# Patient Record
Sex: Female | Born: 1960 | Race: White | Hispanic: No | Marital: Married | Smoking: Former smoker
Health system: Southern US, Community
[De-identification: ages and names within clinical notes are randomized; demographics above are authoritative.]

## PROBLEM LIST (undated history)

## (undated) DIAGNOSIS — I70219 Atherosclerosis of native arteries of extremities with intermittent claudication, unspecified extremity: Secondary | ICD-10-CM

## (undated) DIAGNOSIS — Z9189 Other specified personal risk factors, not elsewhere classified: Secondary | ICD-10-CM

## (undated) DIAGNOSIS — J449 Chronic obstructive pulmonary disease, unspecified: Secondary | ICD-10-CM

## (undated) DIAGNOSIS — M25571 Pain in right ankle and joints of right foot: Secondary | ICD-10-CM

## (undated) DIAGNOSIS — M25562 Pain in left knee: Secondary | ICD-10-CM

## (undated) DIAGNOSIS — G8929 Other chronic pain: Secondary | ICD-10-CM

## (undated) DIAGNOSIS — J984 Other disorders of lung: Secondary | ICD-10-CM

## (undated) DIAGNOSIS — I10 Essential (primary) hypertension: Secondary | ICD-10-CM

## (undated) DIAGNOSIS — M5416 Radiculopathy, lumbar region: Secondary | ICD-10-CM

## (undated) DIAGNOSIS — M25572 Pain in left ankle and joints of left foot: Secondary | ICD-10-CM

## (undated) DIAGNOSIS — F17210 Nicotine dependence, cigarettes, uncomplicated: Secondary | ICD-10-CM

## (undated) DIAGNOSIS — M25561 Pain in right knee: Secondary | ICD-10-CM

## (undated) DIAGNOSIS — R0609 Other forms of dyspnea: Secondary | ICD-10-CM

## (undated) DIAGNOSIS — M791 Myalgia, unspecified site: Secondary | ICD-10-CM

## (undated) DIAGNOSIS — R002 Palpitations: Secondary | ICD-10-CM

## (undated) HISTORY — DX: Other specified personal risk factors, not elsewhere classified: Z91.89

## (undated) HISTORY — DX: Other chronic pain: G89.29

## (undated) HISTORY — DX: Pain in right ankle and joints of right foot: M25.571

## (undated) HISTORY — DX: Other disorders of lung: J98.4

## (undated) HISTORY — DX: Chronic obstructive pulmonary disease, unspecified: J44.9

## (undated) HISTORY — DX: Radiculopathy, lumbar region: M54.16

## (undated) HISTORY — PX: CHOLECYSTECTOMY: SHX55

## (undated) HISTORY — DX: Pain in left knee: M25.562

## (undated) HISTORY — DX: Palpitations: R00.2

## (undated) HISTORY — DX: Pain in left ankle and joints of left foot: M25.572

## (undated) HISTORY — DX: Other forms of dyspnea: R06.09

## (undated) HISTORY — DX: Morbid (severe) obesity due to excess calories: E66.01

## (undated) HISTORY — DX: Pain in right knee: M25.561

## (undated) HISTORY — DX: Myalgia, unspecified site: M79.10

## (undated) HISTORY — DX: Nicotine dependence, cigarettes, uncomplicated: F17.210

## (undated) HISTORY — DX: Essential (primary) hypertension: I10

## (undated) HISTORY — PX: ABDOMINAL HYSTERECTOMY: SHX81

## (undated) HISTORY — PX: BACK SURGERY: SHX140

## (undated) HISTORY — DX: Atherosclerosis of native arteries of extremities with intermittent claudication, unspecified extremity: I70.219

## (undated) HISTORY — PX: SKIN CANCER EXCISION: SHX779

---

## 2008-07-04 ENCOUNTER — Ambulatory Visit: Payer: Self-pay | Admitting: Cardiology

## 2008-07-10 ENCOUNTER — Ambulatory Visit: Payer: Self-pay | Admitting: Cardiovascular Disease

## 2008-07-10 ENCOUNTER — Ambulatory Visit: Payer: Self-pay | Admitting: Cardiology

## 2008-07-19 ENCOUNTER — Encounter: Payer: Self-pay | Admitting: Internal Medicine

## 2008-07-19 ENCOUNTER — Ambulatory Visit: Payer: Self-pay | Admitting: Internal Medicine

## 2008-08-07 ENCOUNTER — Ambulatory Visit: Payer: Self-pay | Admitting: Pulmonary Disease

## 2008-08-07 ENCOUNTER — Encounter: Payer: Self-pay | Admitting: Cardiovascular Disease

## 2008-08-07 ENCOUNTER — Ambulatory Visit: Payer: Self-pay | Admitting: Cardiovascular Disease

## 2008-08-07 DIAGNOSIS — I10 Essential (primary) hypertension: Secondary | ICD-10-CM

## 2008-08-07 DIAGNOSIS — Z87891 Personal history of nicotine dependence: Secondary | ICD-10-CM | POA: Insufficient documentation

## 2008-08-07 DIAGNOSIS — F17210 Nicotine dependence, cigarettes, uncomplicated: Secondary | ICD-10-CM

## 2008-08-07 DIAGNOSIS — J984 Other disorders of lung: Secondary | ICD-10-CM

## 2008-08-07 DIAGNOSIS — Z9189 Other specified personal risk factors, not elsewhere classified: Secondary | ICD-10-CM | POA: Insufficient documentation

## 2008-08-07 DIAGNOSIS — R0609 Other forms of dyspnea: Secondary | ICD-10-CM | POA: Insufficient documentation

## 2008-08-07 DIAGNOSIS — R0600 Dyspnea, unspecified: Secondary | ICD-10-CM | POA: Insufficient documentation

## 2008-08-07 DIAGNOSIS — I152 Hypertension secondary to endocrine disorders: Secondary | ICD-10-CM | POA: Insufficient documentation

## 2008-08-07 HISTORY — DX: Dyspnea, unspecified: R06.00

## 2008-08-07 HISTORY — DX: Other disorders of lung: J98.4

## 2008-08-07 HISTORY — DX: Other specified personal risk factors, not elsewhere classified: Z91.89

## 2008-08-07 HISTORY — DX: Nicotine dependence, cigarettes, uncomplicated: F17.210

## 2008-08-07 HISTORY — DX: Other forms of dyspnea: R06.09

## 2008-08-07 HISTORY — DX: Essential (primary) hypertension: I10

## 2008-12-28 ENCOUNTER — Encounter (INDEPENDENT_AMBULATORY_CARE_PROVIDER_SITE_OTHER): Payer: Self-pay | Admitting: *Deleted

## 2009-02-02 ENCOUNTER — Encounter: Payer: Self-pay | Admitting: Pulmonary Disease

## 2010-05-12 ENCOUNTER — Encounter: Payer: Self-pay | Admitting: Pulmonary Disease

## 2010-09-03 NOTE — Assessment & Plan Note (Signed)
[facility]                            CARDIOLOGY OFFICE NOTE   NAME:Robin Keller, Robin Keller                       MRN:          [id number]  DATE:07/10/2008                            DOB:          15-May-1960    A 50 year old patient referred for Robin Keller for ongoing chest pain  and shortness of breath.  The patient also has hypertension and is a  smoker.   The patient was actually admitted to [facility] last Friday or  Saturday.  I reviewed records from there.  Her EKGs were normal.  She  ruled out for myocardial infarction.  She had an exercise stress Myoview  study.  She was able to exercise for about 6 minutes.  Nuclear images  were normal with a good EF.  There was no evidence of ischemia or  infarct.  She followed up with Dr. Prudy Feeler.  She continues to have  sensation of fullness in her chest, it is localized to the left breast  area.  It is not actually a pain or pressure, it is a fullness.  She has  continued shortness of breath.  She has over a 50-pack-year history of  smoking and I suspect as significant emphysema.   Her chest x-ray at [facility] was normal with no evidence of cancer or  lung nodules.  There is no effusion and no cardiomegaly.   I do not see that she had a D-dimer.   The patient continues to have her dyspnea.  There has been no cough or  sputum production.  No hemoptysis.  No pleuritic component.  No lower  extremity edema.   She was recently started on lisinopril and Imdur 30 mg a day for blood  pressure control and symptomatic relief.   Her symptoms have not worsened, but they are persistent.   Nothing in particular she does makes feel better.  They are not  associated with GI symptoms, nausea, GERD, reflux, increase gas, or  diarrhea.   PAST MEDICAL HISTORY:  Remarkable for smoking.  She has had a previous  hysterectomy, previous cholecystectomy, two previous lumbar disk  surgeries, appendectomy, right  wrist surgery for ganglion cyst.   SOCIAL HISTORY:  The patient is married.  She lives with her husband.  He is retired, but she is active.  They have two children.  She is a  smoker.  She does not drink.  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  She works 40-60 hours per  week.  She is fairly sedentary.   FAMILY HISTORY:  Positive for premature coronary disease in mother's  side.   CURRENT MEDICATIONS:  1. Lisinopril 20 a day.  2. Imdur 30 a day.  3. Aspirin a day.   ALLERGIES:  She has no known allergies.   REVIEW OF SYSTEMS:  Otherwise negative.   PHYSICAL EXAMINATION:  GENERAL:  Remarkable for an overweight bronchitic  female in no distress.  VITAL SIGNS:  Blood pressure is 150/80, pulse 70 and regular,  respiratory rate 14, afebrile.  HEENT:  Unremarkable.  Carotids  normal without bruit.  No  lymphadenopathy, thyromegaly, JVP elevation.  LUNGS:  Clear with good diaphragmatic motion.  No active wheezing.  HEART:  S1 and S2.  Normal heart sounds.  PMI normal.  ABDOMEN:  Benign, status post TAH and appendectomy.  No AAA, no  tenderness, no bruit.  No hepatosplenomegaly, hepatojugular reflux,  tenderness.  EXTREMITIES:  Distal pulses are intact.  No edema.  NEURO:  Nonfocal.  SKIN:  Warm and dry.  No muscular weakness.  She has a tattoo in the  left ankle.   EKGs were normal.  Her records were reviewed from [facility].  See above in regards to review of nuclear study.   IMPRESSION:  1. Ongoing pressure and fullness in the chest with a normal nuclear      study, low likelihood of angina, would like to avoid heart      catheterization.  We will check a chest CT to rule out pulmonary      embolism and do high-resolution lung windows as I suspect that this      has more to do with the problem.  2. Smoking cessation, spent less than 10 minutes counseling the      patient.  She was aware of 7 mg patch, long-term risks of  chronic      obstructive pulmonary disease, and cancer were discussed with her.  3. Hypertension, improved control.  Continue lisinopril.  I told her      for the time being, continuing the nitrate is not harmful, but I do      not think she should be on these long-term as there is no evidence      of coronary disease.   I will see the patient back in 4 weeks to reassess her after she has her  CT scan and see how she is doing with her smoking cessation.     Robin Keller. Robin Emms, MD, [facility]  Electronically Signed    PCN/MedQ  DD: 07/10/2008  DT: 07/11/2008  Job #: [id number]

## 2016-12-09 ENCOUNTER — Encounter: Payer: Self-pay | Admitting: Physician Assistant

## 2016-12-09 ENCOUNTER — Ambulatory Visit (INDEPENDENT_AMBULATORY_CARE_PROVIDER_SITE_OTHER): Payer: 59 | Admitting: Physician Assistant

## 2016-12-09 VITALS — BP 138/86 | HR 95 | Temp 98.0°F | Ht 62.0 in | Wt 193.8 lb

## 2016-12-09 DIAGNOSIS — M79671 Pain in right foot: Secondary | ICD-10-CM

## 2016-12-09 DIAGNOSIS — M79672 Pain in left foot: Secondary | ICD-10-CM

## 2016-12-09 DIAGNOSIS — M791 Myalgia, unspecified site: Secondary | ICD-10-CM

## 2016-12-09 DIAGNOSIS — M25572 Pain in left ankle and joints of left foot: Secondary | ICD-10-CM

## 2016-12-09 DIAGNOSIS — M25562 Pain in left knee: Secondary | ICD-10-CM

## 2016-12-09 DIAGNOSIS — M25571 Pain in right ankle and joints of right foot: Secondary | ICD-10-CM | POA: Insufficient documentation

## 2016-12-09 DIAGNOSIS — M25561 Pain in right knee: Secondary | ICD-10-CM

## 2016-12-09 HISTORY — DX: Myalgia, unspecified site: M79.10

## 2016-12-09 HISTORY — DX: Pain in right ankle and joints of right foot: M25.571

## 2016-12-09 HISTORY — DX: Pain in right knee: M25.561

## 2016-12-09 MED ORDER — CYCLOBENZAPRINE HCL 10 MG PO TABS: 10.0000 mg | ORAL_TABLET | Freq: Three times a day (TID) | ORAL | 0 refills | Status: DC | PRN

## 2016-12-09 MED ORDER — PREDNISONE 10 MG (48) PO TBPK: ORAL_TABLET | ORAL | 0 refills | Status: DC

## 2016-12-09 NOTE — Patient Instructions (Signed)
In a few days you may receive a survey in the mail or online from [employer]aney regarding your visit with us today. Please take a moment to fill this out. Your feedback is very important to our whole office. It can help us better understand your needs as well as improve your experience and satisfaction. Thank you for taking your time to complete it. We care about you.  Arleta Ostrum, PA-C  

## 2016-12-10 LAB — CMP14+EGFR
ALK PHOS: 144 IU/L — AB (ref 39–117)
ALT: 18 IU/L (ref 0–32)
AST: 19 IU/L (ref 0–40)
Albumin/Globulin Ratio: 1.4 (ref 1.2–2.2)
Albumin: 4.2 g/dL (ref 3.5–5.5)
BUN/Creatinine Ratio: 17 (ref 9–23)
BUN: 12 mg/dL (ref 6–24)
CHLORIDE: 105 mmol/L (ref 96–106)
CO2: 21 mmol/L (ref 20–29)
Calcium: 9.1 mg/dL (ref 8.7–10.2)
Creatinine, Ser: 0.71 mg/dL (ref 0.57–1.00)
GFR calc Af Amer: 109 mL/min/{1.73_m2} (ref >59)
GFR calc non Af Amer: 95 mL/min/{1.73_m2} (ref >59)
GLUCOSE: 112 mg/dL — AB (ref 65–99)
Globulin, Total: 3 g/dL (ref 1.5–4.5)
Potassium: 4.1 mmol/L (ref 3.5–5.2)
Sodium: 141 mmol/L (ref 134–144)
TOTAL PROTEIN: 7.2 g/dL (ref 6.0–8.5)

## 2016-12-10 LAB — ARTHRITIS PANEL
BASOS ABS: 0.1 10*3/uL (ref 0.0–0.2)
Basos: 0 %
EOS (ABSOLUTE): 0.3 10*3/uL (ref 0.0–0.4)
Eos: 3 %
HEMATOCRIT: 40.4 % (ref 34.0–46.6)
Hemoglobin: 13.8 g/dL (ref 11.1–15.9)
IMMATURE GRANULOCYTES: 0 %
Immature Grans (Abs): 0 10*3/uL (ref 0.0–0.1)
Lymphocytes Absolute: 4.1 10*3/uL — ABNORMAL HIGH (ref 0.7–3.1)
Lymphs: 34 %
MCH: 29.9 pg (ref 26.6–33.0)
MCHC: 34.2 g/dL (ref 31.5–35.7)
MCV: 87 fL (ref 79–97)
MONOS ABS: 0.6 10*3/uL (ref 0.1–0.9)
Monocytes: 5 %
NEUTROS PCT: 58 %
Neutrophils Absolute: 6.9 10*3/uL (ref 1.4–7.0)
PLATELETS: 274 10*3/uL (ref 150–379)
RBC: 4.62 x10E6/uL (ref 3.77–5.28)
RDW: 14.3 % (ref 12.3–15.4)
Sed Rate: 45 mm/hr — ABNORMAL HIGH (ref 0–40)
URIC ACID: 5.1 mg/dL (ref 2.5–7.1)
WBC: 11.9 10*3/uL — ABNORMAL HIGH (ref 3.4–10.8)

## 2016-12-10 NOTE — Progress Notes (Signed)
BP 138/86   Pulse 95   Temp 98 F (36.7 C) (Oral)   Ht 5' 2" (1.575 m)   Wt 193 lb 12.8 oz (87.9 kg)   BMI 35.45 kg/m    Subjective:    Patient ID: Robin Keller, female    DOB: 07/14/1960, 56 y.o.   MRN: [id number]  HPI: Robin Keller is a 56 y.o. female presenting on 12/09/2016 for Leg Pain (with knots coming up on bilateral legs)  This patient comes in having multiple musculoskeletal complaints. Over the past few months she has had severe amount of pain and swelling in the areas around her knees and ankles. The swelling continues even after the warmth has gone down. She was given a Depo-Medrol shot at the urgent care and she states this made her feel much better for a couple weeks. She is really not consistently taken any over-the-counter medications. She does not know of any family history of rheumatoid arthritis or lupus. She's not had any specific injuries to her lower legs.  Relevant past medical, surgical, family and social history reviewed and updated as indicated. Allergies and medications reviewed and updated.  History reviewed. No pertinent past medical history.  Past Surgical History:  Procedure Laterality Date  . ABDOMINAL HYSTERECTOMY    . BACK SURGERY      Review of Systems  Constitutional: Negative.   HENT: Negative.   Eyes: Negative.   Respiratory: Negative.   Cardiovascular: Negative.   Gastrointestinal: Negative.   Genitourinary: Negative.   Musculoskeletal: Positive for arthralgias, gait problem, joint swelling and myalgias.  Skin: Positive for color change.    Allergies as of 12/09/2016   No Known Allergies     Medication List       Accurate as of 12/09/16 11:59 PM. Always use your most recent med list.          cyclobenzaprine 10 MG tablet Commonly known as:  FLEXERIL Take 1 tablet (10 mg total) by mouth 3 (three) times daily as needed for muscle spasms.   predniSONE 10 MG (48) Tbpk tablet Commonly known as:  STERAPRED UNI-PAK 48  TAB Take for 12 days            Discharge Care Instructions        Start     Ordered   12/09/16 0000  CBC with Differential/Platelet     12/09/16 1447   12/09/16 0000  CMP14+EGFR     12/09/16 1447   12/09/16 0000  Arthritis Panel     12/09/16 1447   12/09/16 0000  predniSONE (STERAPRED UNI-PAK 48 TAB) 10 MG (48) TBPK tablet    Question:  Supervising Provider  Answer:  Kenn File L   12/09/16 1447   12/09/16 0000  cyclobenzaprine (FLEXERIL) 10 MG tablet  3 times daily PRN    Question:  Supervising Provider  Answer:  Timmothy Euler   12/09/16 1447         Objective:    BP 138/86   Pulse 95   Temp 98 F (36.7 C) (Oral)   Ht 5' 2" (1.575 m)   Wt 193 lb 12.8 oz (87.9 kg)   BMI 35.45 kg/m   No Known Allergies  Physical Exam  Constitutional: She is oriented to person, place, and time. She appears well-developed and well-nourished.  HENT:  Head: Normocephalic and atraumatic.  Eyes: Pupils are equal, round, and reactive to light. Conjunctivae and EOM are normal.  Cardiovascular: Normal rate, regular rhythm, normal  heart sounds and intact distal pulses.   Pulmonary/Chest: Effort normal and breath sounds normal.  Abdominal: Soft. Bowel sounds are normal.  Musculoskeletal:       Right knee: She exhibits swelling, erythema and bony tenderness. She exhibits no effusion. Tenderness found. Lateral joint line tenderness noted.       Left knee: She exhibits swelling, erythema and bony tenderness.       Legs: Neurological: She is alert and oriented to person, place, and time. She has normal reflexes.  Skin: Skin is warm and dry. No rash noted.  Psychiatric: She has a normal mood and affect. Her behavior is normal. Judgment and thought content normal.    Results for orders placed or performed in visit on 12/09/16  CMP14+EGFR  Result Value Ref Range   Glucose 112 (H) 65 - 99 mg/dL   BUN 12 6 - 24 mg/dL   Creatinine, Ser 0.71 0.57 - 1.00 mg/dL   GFR calc non Af  Amer 95 >59 mL/min/1.73   GFR calc Af Amer 109 >59 mL/min/1.73   BUN/Creatinine Ratio 17 9 - 23   Sodium 141 134 - 144 mmol/L   Potassium 4.1 3.5 - 5.2 mmol/L   Chloride 105 96 - 106 mmol/L   CO2 21 20 - 29 mmol/L   Calcium 9.1 8.7 - 10.2 mg/dL   Total Protein 7.2 6.0 - 8.5 g/dL   Albumin 4.2 3.5 - 5.5 g/dL   Globulin, Total 3.0 1.5 - 4.5 g/dL   Albumin/Globulin Ratio 1.4 1.2 - 2.2   Bilirubin Total <0.2 0.0 - 1.2 mg/dL   Alkaline Phosphatase 144 (H) 39 - 117 IU/L   AST 19 0 - 40 IU/L   ALT 18 0 - 32 IU/L  Arthritis Panel  Result Value Ref Range   Uric Acid 5.1 2.5 - 7.1 mg/dL   Rhuematoid fact SerPl-aCnc <10.0 0.0 - 13.9 IU/mL   WBC 11.9 (H) 3.4 - 10.8 x10E3/uL   RBC 4.62 3.77 - 5.28 x10E6/uL   Hemoglobin 13.8 11.1 - 15.9 g/dL   Hematocrit 40.4 34.0 - 46.6 %   MCV 87 79 - 97 fL   MCH 29.9 26.6 - 33.0 pg   MCHC 34.2 31.5 - 35.7 g/dL   RDW 14.3 12.3 - 15.4 %   Platelets 274 150 - 379 x10E3/uL   Neutrophils 58 Not Estab. %   Lymphs 34 Not Estab. %   Monocytes 5 Not Estab. %   Eos 3 Not Estab. %   Basos 0 Not Estab. %   Neutrophils Absolute 6.9 1.4 - 7.0 x10E3/uL   Lymphocytes Absolute 4.1 (H) 0.7 - 3.1 x10E3/uL   Monocytes Absolute 0.6 0.1 - 0.9 x10E3/uL   EOS (ABSOLUTE) 0.3 0.0 - 0.4 x10E3/uL   Basophils Absolute 0.1 0.0 - 0.2 x10E3/uL   Immature Granulocytes 0 Not Estab. %   Immature Grans (Abs) 0.0 0.0 - 0.1 x10E3/uL   Sed Rate 45 (H) 0 - 40 mm/hr      Assessment & Plan:   1. Arthralgia of both knees - CBC with Differential/Platelet - CMP14+EGFR - Arthritis Panel - predniSONE (STERAPRED UNI-PAK 48 TAB) 10 MG (48) TBPK tablet; Take for 12 days  Dispense: 48 tablet; Refill: 0  2. Pain in joints of both feet - CBC with Differential/Platelet - CMP14+EGFR - Arthritis Panel  3. Myalgia - CBC with Differential/Platelet - CMP14+EGFR - Arthritis Panel - cyclobenzaprine (FLEXERIL) 10 MG tablet; Take 1 tablet (10 mg total) by mouth 3 (three) times daily as   needed  for muscle spasms.  Dispense: 90 tablet; Refill: 0    Current Outpatient Prescriptions:  .  cyclobenzaprine (FLEXERIL) 10 MG tablet, Take 1 tablet (10 mg total) by mouth 3 (three) times daily as needed for muscle spasms., Disp: 90 tablet, Rfl: 0 .  predniSONE (STERAPRED UNI-PAK 48 TAB) 10 MG (48) TBPK tablet, Take for 12 days, Disp: 48 tablet, Rfl: 0 Continue all other maintenance medications as listed above.  Follow up plan: Return in about 4 weeks (around 01/06/2017).  Educational handout given for survey  Angel S. Jones PA-C [facility] [address]  [place], [place] [address] [phone]   12/10/2016, 2:14 PM   

## 2016-12-15 NOTE — Addendum Note (Signed)
Addended by: Tamera Punt on: 12/15/2016 03:08 PM   Modules accepted: Orders

## 2017-01-06 ENCOUNTER — Ambulatory Visit (INDEPENDENT_AMBULATORY_CARE_PROVIDER_SITE_OTHER): Payer: 59 | Admitting: Physician Assistant

## 2017-01-06 ENCOUNTER — Encounter: Payer: Self-pay | Admitting: Physician Assistant

## 2017-01-06 VITALS — BP 146/85 | HR 99 | Temp 98.2°F | Ht 62.0 in | Wt 197.6 lb

## 2017-01-06 DIAGNOSIS — M79672 Pain in left foot: Secondary | ICD-10-CM

## 2017-01-06 DIAGNOSIS — M25562 Pain in left knee: Secondary | ICD-10-CM | POA: Diagnosis not present

## 2017-01-06 DIAGNOSIS — M25561 Pain in right knee: Secondary | ICD-10-CM

## 2017-01-06 DIAGNOSIS — M791 Myalgia, unspecified site: Secondary | ICD-10-CM

## 2017-01-06 DIAGNOSIS — M25572 Pain in left ankle and joints of left foot: Secondary | ICD-10-CM

## 2017-01-06 DIAGNOSIS — M25571 Pain in right ankle and joints of right foot: Secondary | ICD-10-CM

## 2017-01-06 DIAGNOSIS — M79671 Pain in right foot: Secondary | ICD-10-CM

## 2017-01-06 MED ORDER — HYDROCODONE-ACETAMINOPHEN 10-325 MG PO TABS: 1.0000 | ORAL_TABLET | Freq: Four times a day (QID) | ORAL | 0 refills | Status: DC | PRN

## 2017-01-06 MED ORDER — PREDNISONE 10 MG PO TABS: 10.0000 mg | ORAL_TABLET | Freq: Every day | ORAL | 1 refills | Status: DC

## 2017-01-06 NOTE — Progress Notes (Signed)
BP (!) 146/85   Pulse 99   Temp 98.2 F (36.8 C) (Oral)   Ht 5' 2"  (1.575 m)   Wt 197 lb 9.6 oz (89.6 kg)   BMI 36.14 kg/m    Subjective:    Patient ID: Robin Keller, female    DOB: 1960/05/30, 56 y.o.   MRN: [id number]  HPI: Robin Keller is a 56 y.o. female presenting on 01/06/2017 for Follow-up (4 week rck)  The patient took a course of prednisone and felt better while she was on it however shortly after she had a significant flareup of her knee swelling and lower leg nodules repeating. She had to miss her rheumatology appointment last week because of bad weather and her next appointment will not be until October 31. We discussed her lab findings, sedimentation rate elevated, and all the other arthritis profile was normal. Pain is 9-10/10 and keeps her awake most nights. 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 She has periods that are quite ill at this time.  Relevant past medical, surgical, family and social history reviewed and updated as indicated. Allergies and medications reviewed and updated.  History reviewed. No pertinent past medical history.  Past Surgical History:  Procedure Laterality Date  . ABDOMINAL HYSTERECTOMY    . BACK SURGERY      Review of Systems  Constitutional: Negative.  Negative for activity change, fatigue and fever.  HENT: Negative.   Eyes: Negative.   Respiratory: Negative.  Negative for cough.   Cardiovascular: Negative.  Negative for chest pain.  Gastrointestinal: Negative.  Negative for abdominal pain.  Endocrine: Negative.   Genitourinary: Negative.  Negative for dysuria.  Musculoskeletal: Positive for arthralgias, gait problem, joint swelling and myalgias.  Skin: Positive for color change.    Allergies as of 01/06/2017   No Known Allergies     Medication List       Accurate as of 01/06/17 11:55 AM. Always use your most recent med list.          HYDROcodone-acetaminophen 10-325 MG tablet Commonly known as:   NORCO Take 1-2 tablets by mouth every 6 (six) hours as needed.   predniSONE 10 MG tablet Commonly known as:  DELTASONE Take 1 tablet (10 mg total) by mouth daily with breakfast.            Discharge Care Instructions        Start     Ordered   01/06/17 0000  predniSONE (DELTASONE) 10 MG tablet  Daily with breakfast    Question:  Supervising Provider  Answer:  Timmothy Euler   01/06/17 1010   01/06/17 0000  HYDROcodone-acetaminophen (NORCO) 10-325 MG tablet  Every 6 hours PRN    Question:  Supervising Provider  Answer:  Timmothy Euler   01/06/17 1010         Objective:    BP (!) 146/85   Pulse 99   Temp 98.2 F (36.8 C) (Oral)   Ht 5' 2"  (1.575 m)   Wt 197 lb 9.6 oz (89.6 kg)   BMI 36.14 kg/m   No Known Allergies  Physical Exam  Constitutional: She is oriented to person, place, and time. She appears well-developed and well-nourished.  HENT:  Head: Normocephalic and atraumatic.  Eyes: Pupils are equal, round, and reactive to light. Conjunctivae and EOM are normal.  Cardiovascular: Normal rate, regular rhythm, normal heart sounds and intact distal pulses.   Pulmonary/Chest: Effort normal and breath sounds  normal.  Abdominal: Soft. Bowel sounds are normal.  Musculoskeletal:       Right knee: She exhibits decreased range of motion, swelling and erythema. Tenderness found.       Legs: Neurological: She is alert and oriented to person, place, and time. She has normal reflexes.  Skin: Skin is warm and dry. No rash noted.  Psychiatric: She has a normal mood and affect. Her behavior is normal. Judgment and thought content normal.    Results for orders placed or performed in visit on 12/09/16  CMP14+EGFR  Result Value Ref Range   Glucose 112 (H) 65 - 99 mg/dL   BUN 12 6 - 24 mg/dL   Creatinine, Ser 0.71 0.57 - 1.00 mg/dL   GFR calc non Af Amer 95 >59 mL/min/1.73   GFR calc Af Amer 109 >59 mL/min/1.73   BUN/Creatinine Ratio 17 9 - 23   Sodium 141 134 - 144  mmol/L   Potassium 4.1 3.5 - 5.2 mmol/L   Chloride 105 96 - 106 mmol/L   CO2 21 20 - 29 mmol/L   Calcium 9.1 8.7 - 10.2 mg/dL   Total Protein 7.2 6.0 - 8.5 g/dL   Albumin 4.2 3.5 - 5.5 g/dL   Globulin, Total 3.0 1.5 - 4.5 g/dL   Albumin/Globulin Ratio 1.4 1.2 - 2.2   Bilirubin Total <0.2 0.0 - 1.2 mg/dL   Alkaline Phosphatase 144 (H) 39 - 117 IU/L   AST 19 0 - 40 IU/L   ALT 18 0 - 32 IU/L  Arthritis Panel  Result Value Ref Range   Uric Acid 5.1 2.5 - 7.1 mg/dL   Rhuematoid fact SerPl-aCnc <10.0 0.0 - 13.9 IU/mL   WBC 11.9 (H) 3.4 - 10.8 x10E3/uL   RBC 4.62 3.77 - 5.28 x10E6/uL   Hemoglobin 13.8 11.1 - 15.9 g/dL   Hematocrit 40.4 34.0 - 46.6 %   MCV 87 79 - 97 fL   MCH 29.9 26.6 - 33.0 pg   MCHC 34.2 31.5 - 35.7 g/dL   RDW 14.3 12.3 - 15.4 %   Platelets 274 150 - 379 x10E3/uL   Neutrophils 58 Not Estab. %   Lymphs 34 Not Estab. %   Monocytes 5 Not Estab. %   Eos 3 Not Estab. %   Basos 0 Not Estab. %   Neutrophils Absolute 6.9 1.4 - 7.0 x10E3/uL   Lymphocytes Absolute 4.1 (H) 0.7 - 3.1 x10E3/uL   Monocytes Absolute 0.6 0.1 - 0.9 x10E3/uL   EOS (ABSOLUTE) 0.3 0.0 - 0.4 x10E3/uL   Basophils Absolute 0.1 0.0 - 0.2 x10E3/uL   Immature Granulocytes 0 Not Estab. %   Immature Grans (Abs) 0.0 0.0 - 0.1 x10E3/uL   Sed Rate 45 (H) 0 - 40 mm/hr      Assessment & Plan:   1. Arthralgia of both knees - predniSONE (DELTASONE) 10 MG tablet; Take 1 tablet (10 mg total) by mouth daily with breakfast.  Dispense: 30 tablet; Refill: 1 - HYDROcodone-acetaminophen (NORCO) 10-325 MG tablet; Take 1-2 tablets by mouth every 6 (six) hours as needed.  Dispense: 40 tablet; Refill: 0  2. Pain in joints of both feet - predniSONE (DELTASONE) 10 MG tablet; Take 1 tablet (10 mg total) by mouth daily with breakfast.  Dispense: 30 tablet; Refill: 1 - HYDROcodone-acetaminophen (NORCO) 10-325 MG tablet; Take 1-2 tablets by mouth every 6 (six) hours as needed.  Dispense: 40 tablet; Refill: 0  3.  Myalgia    Current Outpatient Prescriptions:  .  HYDROcodone-acetaminophen (  NORCO) 10-325 MG tablet, Take 1-2 tablets by mouth every 6 (six) hours as needed., Disp: 40 tablet, Rfl: 0 .  predniSONE (DELTASONE) 10 MG tablet, Take 1 tablet (10 mg total) by mouth daily with breakfast., Disp: 30 tablet, Rfl: 1 Continue all other maintenance medications as listed above.  Follow up plan: Return in about 2 months (around 03/08/2017) for recheck.  Educational handout given for [place] PA-C [facility] [address]  [place], [place] [address] [phone]   01/06/2017, 11:55 AM

## 2017-01-06 NOTE — Patient Instructions (Signed)
In a few days you may receive a survey in the mail or online from [employer] regarding your visit with us today. Please take a moment to fill this out. Your feedback is very important to our whole office. It can help us better understand your needs as well as improve your experience and satisfaction. Thank you for taking your time to complete it. We care about you.  Rishaan Gunner, PA-C  

## 2017-02-18 DIAGNOSIS — M79605 Pain in left leg: Secondary | ICD-10-CM | POA: Diagnosis not present

## 2017-02-18 DIAGNOSIS — M79604 Pain in right leg: Secondary | ICD-10-CM | POA: Diagnosis not present

## 2017-03-02 ENCOUNTER — Ambulatory Visit: Payer: 59 | Admitting: Physician Assistant

## 2017-03-02 ENCOUNTER — Encounter: Payer: Self-pay | Admitting: Physician Assistant

## 2017-03-02 DIAGNOSIS — I70219 Atherosclerosis of native arteries of extremities with intermittent claudication, unspecified extremity: Secondary | ICD-10-CM | POA: Diagnosis not present

## 2017-03-02 DIAGNOSIS — M5416 Radiculopathy, lumbar region: Secondary | ICD-10-CM

## 2017-03-02 DIAGNOSIS — G8929 Other chronic pain: Secondary | ICD-10-CM

## 2017-03-02 HISTORY — DX: Other chronic pain: G89.29

## 2017-03-02 HISTORY — DX: Atherosclerosis of native arteries of extremities with intermittent claudication, unspecified extremity: I70.219

## 2017-03-02 MED ORDER — GABAPENTIN 300 MG PO CAPS: 300.0000 mg | ORAL_CAPSULE | Freq: Every day | ORAL | 3 refills | Status: DC

## 2017-03-02 NOTE — Progress Notes (Signed)
BP (!) 148/86   Pulse 99   Temp 97.9 F (36.6 C) (Oral)   Ht 5' 2"  (1.575 m)   Wt 198 lb 3.2 oz (89.9 kg)   BMI 36.25 kg/m    Subjective:    Patient ID: Robin Keller, female    DOB: 13-Oct-1960, 56 y.o.   MRN: [id number]  HPI: Robin Keller is a 56 y.o. female presenting on 03/02/2017 for Leg Pain; Shoulder Pain; and Neck Pain  Patient comes in for recheck on her chronic radicular pain in the legs.  She is having occasional bout in the left arm.  She was seen by rheumatology and ruled out of any conditions there.  She reports some possible claudication type symptoms.  But she states most of the time the pain is constant regardless of rest or activity.  She admits that she is trying to work on smoking cessation.  We will set up referrals for vascular.  An MRI will be ordered.  Relevant past medical, surgical, family and social history reviewed and updated as indicated. Allergies and medications reviewed and updated.  History reviewed. No pertinent past medical history.  Past Surgical History:  Procedure Laterality Date  . ABDOMINAL HYSTERECTOMY    . BACK SURGERY      Review of Systems  HENT: Negative.   Eyes: Negative.   Respiratory: Negative.   Gastrointestinal: Negative.   Genitourinary: Negative.   Musculoskeletal: Positive for arthralgias, back pain and myalgias.  Skin: Negative.   Neurological: Positive for weakness.    Allergies as of 03/02/2017   No Known Allergies     Medication List        Accurate as of 03/02/17  2:42 PM. Always use your most recent med list.          gabapentin 300 MG capsule Commonly known as:  NEURONTIN Take 1-3 capsules (300-900 mg total) at bedtime by mouth.          Objective:    BP (!) 148/86   Pulse 99   Temp 97.9 F (36.6 C) (Oral)   Ht 5' 2"  (1.575 m)   Wt 198 lb 3.2 oz (89.9 kg)   BMI 36.25 kg/m   No Known Allergies  Physical Exam  Constitutional: She is oriented to person, place, and time. She appears  well-developed and well-nourished.  HENT:  Head: Normocephalic and atraumatic.  Eyes: Conjunctivae and EOM are normal. Pupils are equal, round, and reactive to light.  Cardiovascular: Normal rate, regular rhythm, normal heart sounds and intact distal pulses.  Pulmonary/Chest: Effort normal and breath sounds normal.  Abdominal: Soft. Bowel sounds are normal.  Musculoskeletal:       Lumbar back: She exhibits decreased range of motion, tenderness, pain and spasm.       Back:  Neurological: She is alert and oriented to person, place, and time. She has normal reflexes.  Skin: Skin is warm and dry. No rash noted.  Psychiatric: She has a normal mood and affect. Her behavior is normal. Judgment and thought content normal.    Results for orders placed or performed in visit on 12/09/16  CMP14+EGFR  Result Value Ref Range   Glucose 112 (H) 65 - 99 mg/dL   BUN 12 6 - 24 mg/dL   Creatinine, Ser 0.71 0.57 - 1.00 mg/dL   GFR calc non Af Amer 95 >59 mL/min/1.73   GFR calc Af Amer 109 >59 mL/min/1.73   BUN/Creatinine Ratio 17 9 - 23   Sodium 141  134 - 144 mmol/L   Potassium 4.1 3.5 - 5.2 mmol/L   Chloride 105 96 - 106 mmol/L   CO2 21 20 - 29 mmol/L   Calcium 9.1 8.7 - 10.2 mg/dL   Total Protein 7.2 6.0 - 8.5 g/dL   Albumin 4.2 3.5 - 5.5 g/dL   Globulin, Total 3.0 1.5 - 4.5 g/dL   Albumin/Globulin Ratio 1.4 1.2 - 2.2   Bilirubin Total <0.2 0.0 - 1.2 mg/dL   Alkaline Phosphatase 144 (H) 39 - 117 IU/L   AST 19 0 - 40 IU/L   ALT 18 0 - 32 IU/L  Arthritis Panel  Result Value Ref Range   Uric Acid 5.1 2.5 - 7.1 mg/dL   Rhuematoid fact SerPl-aCnc <10.0 0.0 - 13.9 IU/mL   WBC 11.9 (H) 3.4 - 10.8 x10E3/uL   RBC 4.62 3.77 - 5.28 x10E6/uL   Hemoglobin 13.8 11.1 - 15.9 g/dL   Hematocrit 40.4 34.0 - 46.6 %   MCV 87 79 - 97 fL   MCH 29.9 26.6 - 33.0 pg   MCHC 34.2 31.5 - 35.7 g/dL   RDW 14.3 12.3 - 15.4 %   Platelets 274 150 - 379 x10E3/uL   Neutrophils 58 Not Estab. %   Lymphs 34 Not Estab. %     Monocytes 5 Not Estab. %   Eos 3 Not Estab. %   Basos 0 Not Estab. %   Neutrophils Absolute 6.9 1.4 - 7.0 x10E3/uL   Lymphocytes Absolute 4.1 (H) 0.7 - 3.1 x10E3/uL   Monocytes Absolute 0.6 0.1 - 0.9 x10E3/uL   EOS (ABSOLUTE) 0.3 0.0 - 0.4 x10E3/uL   Basophils Absolute 0.1 0.0 - 0.2 x10E3/uL   Immature Granulocytes 0 Not Estab. %   Immature Grans (Abs) 0.0 0.0 - 0.1 x10E3/uL   Sed Rate 45 (H) 0 - 40 mm/hr      Assessment & Plan:   1. Chronic radicular lumbar pain - MR Lumbar Spine Wo Contrast; Future  2. Extremity atherosclerosis with intermittent claudication ([place]) - Ambulatory referral to Vascular Surgery    Current Outpatient Medications:  .  gabapentin (NEURONTIN) 300 MG capsule, Take 1-3 capsules (300-900 mg total) at bedtime by mouth., Disp: 90 capsule, Rfl: 3 Continue all other maintenance medications as listed above.  Follow up plan: Recheck 1-2 months  Educational handout given for [place] PA-C [place] [address]  [place], [place] [address] [phone]   03/02/2017, 2:42 PM

## 2017-03-02 NOTE — Patient Instructions (Signed)
In a few days you may receive a survey in the mail or online from [employer] regarding your visit with us today. Please take a moment to fill this out. Your feedback is very important to our whole office. It can help us better understand your needs as well as improve your experience and satisfaction. Thank you for taking your time to complete it. We care about you.  Aiman Sonn, PA-C  

## 2017-03-10 ENCOUNTER — Other Ambulatory Visit: Payer: Self-pay

## 2017-03-10 DIAGNOSIS — I739 Peripheral vascular disease, unspecified: Secondary | ICD-10-CM

## 2017-03-16 ENCOUNTER — Ambulatory Visit (HOSPITAL_COMMUNITY): Admission: RE | Admit: 2017-03-16 | Payer: 59 | Source: Ambulatory Visit

## 2017-04-01 ENCOUNTER — Other Ambulatory Visit: Payer: Self-pay | Admitting: Physician Assistant

## 2017-04-01 DIAGNOSIS — M5416 Radiculopathy, lumbar region: Principal | ICD-10-CM

## 2017-04-01 DIAGNOSIS — G8929 Other chronic pain: Secondary | ICD-10-CM

## 2017-04-15 ENCOUNTER — Ambulatory Visit (INDEPENDENT_AMBULATORY_CARE_PROVIDER_SITE_OTHER): Payer: 59 | Admitting: Surgery

## 2017-04-15 ENCOUNTER — Encounter: Payer: Self-pay | Admitting: Surgery

## 2017-04-15 ENCOUNTER — Ambulatory Visit (HOSPITAL_COMMUNITY)
Admission: RE | Admit: 2017-04-15 | Discharge: 2017-04-15 | Disposition: A | Discharge status: Home / Self Care | Payer: 59 | Source: Ambulatory Visit | Attending: Surgery | Admitting: Surgery

## 2017-04-15 VITALS — BP 158/92 | HR 101 | Temp 97.8°F | Resp 18 | Ht 61.0 in | Wt 194.2 lb

## 2017-04-15 DIAGNOSIS — I739 Peripheral vascular disease, unspecified: Secondary | ICD-10-CM | POA: Diagnosis not present

## 2017-04-15 DIAGNOSIS — I70213 Atherosclerosis of native arteries of extremities with intermittent claudication, bilateral legs: Secondary | ICD-10-CM

## 2017-04-15 MED ORDER — CILOSTAZOL 100 MG PO TABS: 100.0000 mg | ORAL_TABLET | Freq: Two times a day (BID) | ORAL | 11 refills | Status: DC

## 2017-04-15 NOTE — Progress Notes (Signed)
History of Present Illness:  Patient is a 56 y.o. year old female who presents for evaluation of bilateral leg pain with symptoms of claudication.  The right leg bothers her more than the left..  The patient currently describes a cramping sensation that starts at the ankles and moves up her legs to her hips.  She can walk about 50 feet before she has to stop and rest. The pain does wake her at night and she takes hot baths to soak multiple times a day which seems to help.  There is no history of ulcerations on the feet.  Atherosclerotic risk factors and other medical problems include Tobacco abuse > 15 years.  She is working on quitting.  She denise DM, HTN, and hypercholesterolemia.  History reviewed. No pertinent past medical history.  Past Surgical History:  Procedure Laterality Date  . ABDOMINAL HYSTERECTOMY    . BACK SURGERY      ROS:   General:  No weight loss, Fever, chills  HEENT: No recent headaches, no nasal bleeding, no visual changes, no sore throat  Neurologic: No dizziness, blackouts, seizures. No recent symptoms of stroke or mini- stroke. No recent episodes of slurred speech, or temporary blindness.  Cardiac: No recent episodes of chest pain/pressure, no shortness of breath at rest.  No shortness of breath with exertion.  Denies history of atrial fibrillation or irregular heartbeat  Vascular: No history of rest pain in feet.  No history of claudication.  No history of non-healing ulcer, No history of DVT   Pulmonary: No home oxygen, no productive cough, no hemoptysis,  No asthma or wheezing  Musculoskeletal:  [ ]  Arthritis, [x ] Low back pain,  [ ]  Joint pain  Hematologic:No history of hypercoagulable state.  No history of easy bleeding.  No history of anemia  Gastrointestinal: No hematochezia or melena,  No gastroesophageal reflux, no trouble swallowing  Urinary: [ ]  chronic Kidney disease, [ ]  on HD - [ ]  MWF or [ ]  TTHS, [ ]  Burning with urination, [ ]  Frequent  urination, [ ]  Difficulty urinating;   Skin: No rashes  Psychological: No history of anxiety,  No history of depression  Social History Social History   Tobacco Use  . Smoking status: Current Every Day Smoker    Packs/day: 0.50    Types: Cigarettes  . Smokeless tobacco: Never Used  Substance Use Topics  . Alcohol use: No  . Drug use: No    Family History Family History  Problem Relation Age of Onset  . Hypertension Mother   . Diabetes Mother   . Hyperlipidemia Mother   . Diabetes Father   . COPD Maternal Grandmother     Allergies  No Known Allergies   Current Outpatient Medications  Medication Sig Dispense Refill  . gabapentin (NEURONTIN) 300 MG capsule Take 1-3 capsules (300-900 mg total) at bedtime by mouth. 90 capsule 3   No current facility-administered medications for this visit.     Physical Examination  Vitals:   04/15/17 1439  BP: (!) 158/92  Pulse: (!) 101  Resp: 18  Temp: 97.8 F (36.6 C)  TempSrc: Oral  SpO2: 98%  Weight: 194 lb 3.2 oz (88.1 kg)  Height: 5\' 1"  (1.549 m)    Body mass index is 36.69 kg/m.  General:  Alert and oriented, no acute distress HEENT: Normal Neck: No bruit or JVD Pulmonary: Clear to auscultation bilaterally Cardiac: Regular Rate and Rhythm without murmur Abdomen: Soft, non-tender, non-distended, no mass,  no scars Skin: No rash Extremity Pulses:  2+ radial, brachial, femoral, left dorsalis pedis, bilateral posterior tibial pulses bilaterally Musculoskeletal: No deformity or edema  Neurologic: Upper and lower extremity motor 5/5 and symmetric  DATA:  ABI's  Right 71 % with TBI 62 % Left 98 % with TBI 74 %   ASSESSMENT:  PAD with right LE claudication  Tobacco abuse   PLAN:  She has had right leg pain that has stopped her from ambulating more than 50 feet at a time since early July.  Her right LE symptoms are consistent with Claudication.  She reports no hx of non healing ulcers.  She has a plan for  smoking cessation.  We want her to start taking a daily 81mg  aspirin and continue walking as much as she can daily.  I have prescribed Pletal 100 mg daily to help with her sysmptoms of claudication.  We recommend follow up with her primary care physician for cholesterol testing and BP control.      She will f/u in 3 months to review her symptoms.  If she continue to have pain and her symptoms don't improve we will schedule her for an angiogram with right LE run off and possible intervention.   Mosetta PigeonEmma Maureen Collins PA-C [facility]  The patient was seen in conjunction with Dr. Myra GianottiBrabham today  I agree with the above.  I have seen and evaluated the patient.  She has a history of back surgery.  She is complaining of bilateral leg pain however the right is worse than the left.  Her ABIs and palpable pulse indicate there is no significant vascular disease on the left, however her ABI on the right without a palpable pulse clearly shows arterial insufficiency.  Her right leg does bother her more than the left.  I told her that since her symptoms are bilateral and she has normal blood flow to the left, that implies an underlying back etiology which is made worse by the arterial insufficiency on the right leg.  I have encouraged the patient to begin medical therapy for atherosclerotic vascular disease.  This includes taking a baby aspirin daily, blood pressure control, and a statin for hypercholesterolemia.  We also discussed an exercise program, however she feels that she is already very active.  I am going to add cilostazol to her medical regimen to see if this improves her symptoms.  In the interim before she follows up with me, she will discuss with her medical doctor regarding initiation of statin therapy, and further evaluation of her lower back disease.  I will see her back in 3 months  Wells Farrin Shadle

## 2017-04-17 ENCOUNTER — Ambulatory Visit
Admission: RE | Admit: 2017-04-17 | Discharge: 2017-04-17 | Disposition: A | Discharge status: Home / Self Care | Payer: 59 | Source: Ambulatory Visit | Attending: Physician Assistant | Admitting: Physician Assistant

## 2017-04-17 DIAGNOSIS — M5416 Radiculopathy, lumbar region: Principal | ICD-10-CM

## 2017-04-17 DIAGNOSIS — M48061 Spinal stenosis, lumbar region without neurogenic claudication: Secondary | ICD-10-CM | POA: Diagnosis not present

## 2017-04-17 DIAGNOSIS — G8929 Other chronic pain: Secondary | ICD-10-CM

## 2017-04-22 ENCOUNTER — Other Ambulatory Visit: Payer: Self-pay | Admitting: *Deleted

## 2017-04-22 DIAGNOSIS — M5416 Radiculopathy, lumbar region: Principal | ICD-10-CM

## 2017-04-22 DIAGNOSIS — G8929 Other chronic pain: Secondary | ICD-10-CM

## 2017-04-28 ENCOUNTER — Ambulatory Visit: Payer: 59 | Admitting: Physician Assistant

## 2017-04-28 ENCOUNTER — Encounter: Payer: Self-pay | Admitting: Physician Assistant

## 2017-04-28 VITALS — BP 133/81 | HR 92 | Ht 61.0 in | Wt 197.4 lb

## 2017-04-28 DIAGNOSIS — I10 Essential (primary) hypertension: Secondary | ICD-10-CM | POA: Diagnosis not present

## 2017-04-28 MED ORDER — HYDROCHLOROTHIAZIDE 25 MG PO TABS: 25.0000 mg | ORAL_TABLET | Freq: Every day | ORAL | 3 refills | Status: DC

## 2017-04-28 NOTE — Progress Notes (Signed)
BP 133/81   Pulse 92   Ht 5\' 1"  (1.549 m)   Wt 197 lb 6.4 oz (89.5 kg)   BMI 37.30 kg/m    Subjective:    Patient ID: Robin Keller, female    DOB: December 05, 1960, 57 y.o.   MRN: [id number]  HPI: Robin Keller is a 57 y.o. female presenting on 04/28/2017 for Follow-up (discuss blood pressure )  Patient reports having some elevated blood pressure when she is going to her specialist appointment.  She is dealing with a herniated disc and osteophyte complex.  She is going to a back specialist and will probably be looking at injections.  Her cardiovascular visit showed that she had decreased circulation in her lower legs.  She was placed on Pletal and aspirin.  She states that has caused some headache.  Blood pressure was quite elevated when she was there.  She has been checking at home.  And it is also staying elevated at home.  There is a family history of hypertension.  Relevant past medical, surgical, family and social history reviewed and updated as indicated. Allergies and medications reviewed and updated.  History reviewed. No pertinent past medical history.  Past Surgical History:  Procedure Laterality Date  . ABDOMINAL HYSTERECTOMY    . BACK SURGERY      Review of Systems  Constitutional: Negative.   HENT: Negative.   Eyes: Negative.   Respiratory: Negative.  Negative for shortness of breath, wheezing and stridor.   Cardiovascular: Positive for palpitations. Negative for chest pain and leg swelling.  Gastrointestinal: Negative.   Genitourinary: Negative.   Musculoskeletal: Positive for back pain and joint swelling.    Allergies as of 04/28/2017   No Known Allergies     Medication List        Accurate as of 04/28/17  4:19 PM. Always use your most recent med list.          cilostazol 100 MG tablet Commonly known as:  PLETAL Take 1 tablet (100 mg total) by mouth 2 (two) times daily before a meal.   gabapentin 300 MG capsule Commonly known as:  NEURONTIN Take 1-3  capsules (300-900 mg total) at bedtime by mouth.   hydrochlorothiazide 25 MG tablet Commonly known as:  HYDRODIURIL Take 1 tablet (25 mg total) by mouth daily.          Objective:    BP 133/81   Pulse 92   Ht 5\' 1"  (1.549 m)   Wt 197 lb 6.4 oz (89.5 kg)   BMI 37.30 kg/m   No Known Allergies  Physical Exam  Constitutional: She is oriented to person, place, and time. She appears well-developed and well-nourished.  HENT:  Head: Normocephalic and atraumatic.  Eyes: Conjunctivae and EOM are normal. Pupils are equal, round, and reactive to light.  Cardiovascular: Normal rate, regular rhythm, normal heart sounds and intact distal pulses.  Pulmonary/Chest: Effort normal and breath sounds normal.  Abdominal: Soft. Bowel sounds are normal.  Neurological: She is alert and oriented to person, place, and time. She has normal reflexes.  Skin: Skin is warm and dry. No rash noted.  Psychiatric: She has a normal mood and affect. Her behavior is normal. Judgment and thought content normal.        Assessment & Plan:   1. HYPERTENSION, BENIGN - hydrochlorothiazide (HYDRODIURIL) 25 MG tablet; Take 1 tablet (25 mg total) by mouth daily.  Dispense: 90 tablet; Refill: 3    Current Outpatient Medications:  .  cilostazol (PLETAL) 100 MG tablet, Take 1 tablet (100 mg total) by mouth 2 (two) times daily before a meal., Disp: 60 tablet, Rfl: 11 .  gabapentin (NEURONTIN) 300 MG capsule, Take 1-3 capsules (300-900 mg total) at bedtime by mouth., Disp: 90 capsule, Rfl: 3 .  hydrochlorothiazide (HYDRODIURIL) 25 MG tablet, Take 1 tablet (25 mg total) by mouth daily., Disp: 90 tablet, Rfl: 3 Continue all other maintenance medications as listed above.  Follow up plan: Return in about 4 weeks (around 05/26/2017) for recheck.  Educational handout given for DASH diet  Remus Loffler PA-C [facility] [address]  [place], [place] [address] [phone]   04/28/2017, 4:19  PM

## 2017-04-28 NOTE — Patient Instructions (Signed)
DASH Eating Plan DASH stands for "Dietary Approaches to Stop Hypertension." The DASH eating plan is a healthy eating plan that has been shown to reduce high blood pressure (hypertension). It may also reduce your risk for type 2 diabetes, heart disease, and stroke. The DASH eating plan may also help with weight loss. What are tips for following this plan? General guidelines  Avoid eating more than 2,300 mg (milligrams) of salt (sodium) a day. If you have hypertension, you may need to reduce your sodium intake to 1,500 mg a day.  Limit alcohol intake to no more than 1 drink a day for nonpregnant women and 2 drinks a day for men. One drink equals 12 oz of beer, 5 oz of wine, or 1 oz of hard liquor.  Work with your health care provider to maintain a healthy body weight or to lose weight. Ask what an ideal weight is for you.  Get at least 30 minutes of exercise that causes your heart to beat faster (aerobic exercise) most days of the week. Activities may include walking, swimming, or biking.  Work with your health care provider or diet and nutrition specialist (dietitian) to adjust your eating plan to your individual calorie needs. Reading food labels  Check food labels for the amount of sodium per serving. Choose foods with less than 5 percent of the Daily Value of sodium. Generally, foods with less than 300 mg of sodium per serving fit into this eating plan.  To find whole grains, look for the word "whole" as the first word in the ingredient list. Shopping  Buy products labeled as "low-sodium" or "no salt added."  Buy fresh foods. Avoid canned foods and premade or frozen meals. Cooking  Avoid adding salt when cooking. Use salt-free seasonings or herbs instead of table salt or sea salt. Check with your health care provider or pharmacist before using salt substitutes.  Do not fry foods. Cook foods using healthy methods such as baking, boiling, grilling, and broiling instead.  Cook with  heart-healthy oils, such as olive, canola, soybean, or sunflower oil. Meal planning   Eat a balanced diet that includes: ? 5 or more servings of fruits and vegetables each day. At each meal, try to fill half of your plate with fruits and vegetables. ? Up to 6-8 servings of whole grains each day. ? Less than 6 oz of lean meat, poultry, or fish each day. A 3-oz serving of meat is about the same size as a deck of cards. One egg equals 1 oz. ? 2 servings of low-fat dairy each day. ? A serving of nuts, seeds, or beans 5 times each week. ? Heart-healthy fats. Healthy fats called Omega-3 fatty acids are found in foods such as flaxseeds and coldwater fish, like sardines, salmon, and mackerel.  Limit how much you eat of the following: ? Canned or prepackaged foods. ? Food that is high in trans fat, such as fried foods. ? Food that is high in saturated fat, such as fatty meat. ? Sweets, desserts, sugary drinks, and other foods with added sugar. ? Full-fat dairy products.  Do not salt foods before eating.  Try to eat at least 2 vegetarian meals each week.  Eat more home-cooked food and less restaurant, buffet, and fast food.  When eating at a restaurant, ask that your food be prepared with less salt or no salt, if possible. What foods are recommended? The items listed may not be a complete list. Talk with your dietitian about what   dietary choices are best for you. Grains Whole-grain or whole-wheat bread. Whole-grain or whole-wheat pasta. Brown rice. Oatmeal. Quinoa. Bulgur. Whole-grain and low-sodium cereals. Pita bread. Low-fat, low-sodium crackers. Whole-wheat flour tortillas. Vegetables Fresh or frozen vegetables (raw, steamed, roasted, or grilled). Low-sodium or reduced-sodium tomato and vegetable juice. Low-sodium or reduced-sodium tomato sauce and tomato paste. Low-sodium or reduced-sodium canned vegetables. Fruits All fresh, dried, or frozen fruit. Canned fruit in natural juice (without  added sugar). Meat and other protein foods Skinless chicken or turkey. Ground chicken or turkey. Pork with fat trimmed off. Fish and seafood. Egg whites. Dried beans, peas, or lentils. Unsalted nuts, nut butters, and seeds. Unsalted canned beans. Lean cuts of beef with fat trimmed off. Low-sodium, lean deli meat. Dairy Low-fat (1%) or fat-free (skim) milk. Fat-free, low-fat, or reduced-fat cheeses. Nonfat, low-sodium ricotta or cottage cheese. Low-fat or nonfat yogurt. Low-fat, low-sodium cheese. Fats and oils Soft margarine without trans fats. Vegetable oil. Low-fat, reduced-fat, or light mayonnaise and salad dressings (reduced-sodium). Canola, safflower, olive, soybean, and sunflower oils. Avocado. Seasoning and other foods Herbs. Spices. Seasoning mixes without salt. Unsalted popcorn and pretzels. Fat-free sweets. What foods are not recommended? The items listed may not be a complete list. Talk with your dietitian about what dietary choices are best for you. Grains Baked goods made with fat, such as croissants, muffins, or some breads. Dry pasta or rice meal packs. Vegetables Creamed or fried vegetables. Vegetables in a cheese sauce. Regular canned vegetables (not low-sodium or reduced-sodium). Regular canned tomato sauce and paste (not low-sodium or reduced-sodium). Regular tomato and vegetable juice (not low-sodium or reduced-sodium). Pickles. Olives. Fruits Canned fruit in a light or heavy syrup. Fried fruit. Fruit in cream or butter sauce. Meat and other protein foods Fatty cuts of meat. Ribs. Fried meat. Bacon. Sausage. Bologna and other processed lunch meats. Salami. Fatback. Hotdogs. Bratwurst. Salted nuts and seeds. Canned beans with added salt. Canned or smoked fish. Whole eggs or egg yolks. Chicken or turkey with skin. Dairy Whole or 2% milk, cream, and half-and-half. Whole or full-fat cream cheese. Whole-fat or sweetened yogurt. Full-fat cheese. Nondairy creamers. Whipped toppings.  Processed cheese and cheese spreads. Fats and oils Butter. Stick margarine. Lard. Shortening. Ghee. Bacon fat. Tropical oils, such as coconut, palm kernel, or palm oil. Seasoning and other foods Salted popcorn and pretzels. Onion salt, garlic salt, seasoned salt, table salt, and sea salt. Worcestershire sauce. Tartar sauce. Barbecue sauce. Teriyaki sauce. Soy sauce, including reduced-sodium. Steak sauce. Canned and packaged gravies. Fish sauce. Oyster sauce. Cocktail sauce. Horseradish that you find on the shelf. Ketchup. Mustard. Meat flavorings and tenderizers. Bouillon cubes. Hot sauce and Tabasco sauce. Premade or packaged marinades. Premade or packaged taco seasonings. Relishes. Regular salad dressings. Where to find more information:  National Heart, Lung, and Blood Institute: www.nhlbi.nih.gov  American Heart Association: www.heart.org Summary  The DASH eating plan is a healthy eating plan that has been shown to reduce high blood pressure (hypertension). It may also reduce your risk for type 2 diabetes, heart disease, and stroke.  With the DASH eating plan, you should limit salt (sodium) intake to 2,300 mg a day. If you have hypertension, you may need to reduce your sodium intake to 1,500 mg a day.  When on the DASH eating plan, aim to eat more fresh fruits and vegetables, whole grains, lean proteins, low-fat dairy, and heart-healthy fats.  Work with your health care provider or diet and nutrition specialist (dietitian) to adjust your eating plan to your individual   calorie needs. This information is not intended to replace advice given to you by your health care provider. Make sure you discuss any questions you have with your health care provider. Document Released: 03/27/2011 Document Revised: 03/31/2016 Document Reviewed: 03/31/2016 Elsevier Interactive Patient Education  2018 Elsevier Inc.  

## 2017-05-11 ENCOUNTER — Other Ambulatory Visit: Payer: Self-pay | Admitting: Physician Assistant

## 2017-05-11 MED ORDER — METOPROLOL SUCCINATE ER 25 MG PO TB24: 25.0000 mg | ORAL_TABLET | Freq: Every day | ORAL | 3 refills | Status: DC

## 2017-05-11 NOTE — Progress Notes (Signed)
Patient aware.

## 2017-05-19 DIAGNOSIS — I10 Essential (primary) hypertension: Secondary | ICD-10-CM | POA: Diagnosis not present

## 2017-05-19 DIAGNOSIS — M5137 Other intervertebral disc degeneration, lumbosacral region: Secondary | ICD-10-CM | POA: Diagnosis not present

## 2017-05-25 ENCOUNTER — Encounter: Payer: Self-pay | Admitting: Physician Assistant

## 2017-05-25 ENCOUNTER — Ambulatory Visit: Payer: 59 | Admitting: Physician Assistant

## 2017-05-25 VITALS — BP 135/83 | HR 93 | Temp 97.6°F | Ht 61.0 in | Wt 198.4 lb

## 2017-05-25 DIAGNOSIS — J449 Chronic obstructive pulmonary disease, unspecified: Secondary | ICD-10-CM

## 2017-05-25 DIAGNOSIS — R002 Palpitations: Secondary | ICD-10-CM

## 2017-05-25 HISTORY — DX: Chronic obstructive pulmonary disease, unspecified: J44.9

## 2017-05-25 HISTORY — DX: Palpitations: R00.2

## 2017-05-25 MED ORDER — BUDESONIDE-FORMOTEROL FUMARATE 80-4.5 MCG/ACT IN AERO: 2.0000 | INHALATION_SPRAY | Freq: Two times a day (BID) | RESPIRATORY_TRACT | 1 refills | Status: DC

## 2017-05-25 NOTE — Patient Instructions (Signed)
In a few days you may receive a survey in the mail or online from [employer] regarding your visit with us today. Please take a moment to fill this out. Your feedback is very important to our whole office. It can help us better understand your needs as well as improve your experience and satisfaction. Thank you for taking your time to complete it. We care about you.  Meris Reede, PA-C  

## 2017-05-26 NOTE — Progress Notes (Signed)
BP 135/83   Pulse 93   Temp 97.6 F (36.4 C) (Oral)   Ht 5\' 1"  (1.549 m)   Wt 198 lb 6.4 oz (90 kg)   BMI 37.49 kg/m    Subjective:    Patient ID: Robin Keller, female    DOB: [birth year], 57 y.o.   MRN: [id number]  HPI: Robin Keller is a 57 y.o. female presenting on 05/25/2017 for Follow-up  Patient comes in for conditions.  She has been seen by vascular to evaluate for peripheral arterial disease.  There was not significant disease.  On Pletal now.  She also has had neurology and rheumatology evaluations.  There is no significant disease there.  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  She is a smoker and is having more more chronic obstructive pulmonary disease symptoms.  Years ago she did have a pulmonary nodule that was followed.  She has not been to the pulmonologist in some time.  I recommended to her that we get her involved with a pulmonologist.  She agrees.  Patient states that she also has episodes when she is active or of her chest pounding and feeling like her heart is racing off.  It takes a few moments to recover.  She denies any chest pain that goes up into her neck or throat.  She does not become diaphoretic.  I have discussed going to cardiology with her also, she agrees. Relevant past medical, surgical, family and social history reviewed and updated as indicated. Allergies and medications reviewed and updated.  History reviewed. No pertinent past medical history.  Past Surgical History:  Procedure Laterality Date  . ABDOMINAL HYSTERECTOMY    . BACK SURGERY      Review of Systems  Constitutional: Positive for fatigue. Negative for activity change and fever.  HENT: Negative.   Eyes: Negative.   Respiratory: Positive for cough, shortness of breath and wheezing.   Cardiovascular: Negative.  Negative for chest pain.  Gastrointestinal: Negative.  Negative for abdominal pain.  Endocrine: Negative.   Genitourinary: Negative.  Negative  for dysuria.  Musculoskeletal: Negative.   Skin: Negative.   Neurological: Negative.     Allergies as of 05/25/2017   No Known Allergies     Medication List        Accurate as of 05/25/17 11:59 PM. Always use your most recent med list.          budesonide-formoterol 80-4.5 MCG/ACT inhaler Commonly known as:  SYMBICORT Inhale 2 puffs into the lungs 2 (two) times daily.   cilostazol 100 MG tablet Commonly known as:  PLETAL Take 1 tablet (100 mg total) by mouth 2 (two) times daily before a meal.   gabapentin 300 MG capsule Commonly known as:  NEURONTIN Take 1-3 capsules (300-900 mg total) at bedtime by mouth.   hydrochlorothiazide 25 MG tablet Commonly known as:  HYDRODIURIL Take 1 tablet (25 mg total) by mouth daily.   metoprolol succinate 25 MG 24 hr tablet Commonly known as:  TOPROL-XL Take 1 tablet (25 mg total) by mouth daily.          Objective:    BP 135/83   Pulse 93   Temp 97.6 F (36.4 C) (Oral)   Ht 5\' 1"  (1.549 m)   Wt 198 lb 6.4 oz (90 kg)   BMI 37.49 kg/m   No Known Allergies  Physical Exam  Constitutional: She is oriented to person, place, and time. She appears well-developed and  well-nourished.  HENT:  Head: Normocephalic and atraumatic.  Right Ear: Tympanic membrane, external ear and ear canal normal.  Left Ear: Tympanic membrane, external ear and ear canal normal.  Nose: Nose normal. No rhinorrhea.  Mouth/Throat: Oropharynx is clear and moist and mucous membranes are normal. No oropharyngeal exudate or posterior oropharyngeal erythema.  Eyes: Conjunctivae and EOM are normal. Pupils are equal, round, and reactive to light.  Neck: Normal range of motion. Neck supple.  Cardiovascular: Normal rate, regular rhythm, normal heart sounds and intact distal pulses.  Pulmonary/Chest: Effort normal. She has wheezes.  Abdominal: Soft. Bowel sounds are normal.  Neurological: She is alert and oriented to person, place, and time. She has normal reflexes.    Skin: Skin is warm and dry. No rash noted.  Psychiatric: She has a normal mood and affect. Her behavior is normal. Judgment and thought content normal.        Assessment & Plan:   1. Palpitations - Ambulatory referral to Cardiology  2. Chronic obstructive pulmonary disease, unspecified COPD type (HCC) - budesonide-formoterol (SYMBICORT) 80-4.5 MCG/ACT inhaler; Inhale 2 puffs into the lungs 2 (two) times daily.  Dispense: 1 Inhaler; Refill: 1 - Ambulatory referral to Pulmonology    Current Outpatient Medications:  .  budesonide-formoterol (SYMBICORT) 80-4.5 MCG/ACT inhaler, Inhale 2 puffs into the lungs 2 (two) times daily., Disp: 1 Inhaler, Rfl: 1 .  cilostazol (PLETAL) 100 MG tablet, Take 1 tablet (100 mg total) by mouth 2 (two) times daily before a meal., Disp: 60 tablet, Rfl: 11 .  gabapentin (NEURONTIN) 300 MG capsule, Take 1-3 capsules (300-900 mg total) at bedtime by mouth., Disp: 90 capsule, Rfl: 3 .  hydrochlorothiazide (HYDRODIURIL) 25 MG tablet, Take 1 tablet (25 mg total) by mouth daily., Disp: 90 tablet, Rfl: 3 .  metoprolol succinate (TOPROL-XL) 25 MG 24 hr tablet, Take 1 tablet (25 mg total) by mouth daily., Disp: 90 tablet, Rfl: 3 Continue all other maintenance medications as listed above.  Follow up plan: Return in about 4 weeks (around 06/22/2017) for recheck.  Educational handout given for survey  Remus Loffler PA-C [facility] [address]  [place], [place] [address] [phone]   05/26/2017, 10:11 AM

## 2017-06-04 ENCOUNTER — Ambulatory Visit (INDEPENDENT_AMBULATORY_CARE_PROVIDER_SITE_OTHER)
Admission: RE | Admit: 2017-06-04 | Discharge: 2017-06-04 | Disposition: A | Discharge status: Home / Self Care | Payer: 59 | Source: Ambulatory Visit | Attending: Internal Medicine | Admitting: Internal Medicine

## 2017-06-04 ENCOUNTER — Ambulatory Visit: Payer: 59 | Admitting: Internal Medicine

## 2017-06-04 ENCOUNTER — Encounter: Payer: Self-pay | Admitting: Internal Medicine

## 2017-06-04 ENCOUNTER — Other Ambulatory Visit (INDEPENDENT_AMBULATORY_CARE_PROVIDER_SITE_OTHER): Payer: 59

## 2017-06-04 VITALS — BP 132/78 | HR 107 | Ht 61.0 in | Wt 196.6 lb

## 2017-06-04 DIAGNOSIS — F17210 Nicotine dependence, cigarettes, uncomplicated: Secondary | ICD-10-CM

## 2017-06-04 DIAGNOSIS — R0609 Other forms of dyspnea: Secondary | ICD-10-CM

## 2017-06-04 DIAGNOSIS — R0600 Dyspnea, unspecified: Secondary | ICD-10-CM

## 2017-06-04 LAB — CBC WITH DIFFERENTIAL/PLATELET
BASOS PCT: 1.3 % (ref 0.0–3.0)
Basophils Absolute: 0.2 10*3/uL — ABNORMAL HIGH (ref 0.0–0.1)
EOS ABS: 0.4 10*3/uL (ref 0.0–0.7)
EOS PCT: 2.8 % (ref 0.0–5.0)
HCT: 40.5 % (ref 36.0–46.0)
HEMOGLOBIN: 13.6 g/dL (ref 12.0–15.0)
Lymphocytes Relative: 38.7 % (ref 12.0–46.0)
Lymphs Abs: 5.6 10*3/uL — ABNORMAL HIGH (ref 0.7–4.0)
MCHC: 33.5 g/dL (ref 30.0–36.0)
MCV: 88.2 fl (ref 78.0–100.0)
Monocytes Absolute: 0.7 10*3/uL (ref 0.1–1.0)
Monocytes Relative: 4.6 % (ref 3.0–12.0)
Neutro Abs: 7.6 10*3/uL (ref 1.4–7.7)
Neutrophils Relative %: 52.6 % (ref 43.0–77.0)
Platelets: 348 10*3/uL (ref 150.0–400.0)
RBC: 4.59 Mil/uL (ref 3.87–5.11)
RDW: 14.2 % (ref 11.5–15.5)
WBC: 14.4 10*3/uL — AB (ref 4.0–10.5)

## 2017-06-04 NOTE — Patient Instructions (Signed)
Please remember to go to the lab and x-ray department downstairs in the basement  for your tests - we will call you with the results when they are available.    Return for pfts next available

## 2017-06-04 NOTE — Progress Notes (Signed)
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 Pulmonary Consult    Referred by Robin FeelerAngel Jones, PA.  Pt c/o DOE x 2 months. She states that sometimes she gets winded just walking from room to room at home.   cough from sinus drainage nl for her x years  Able to lie flat ok hs No change doe since onset, never sitting or supine but doe room to room = MMRC3 = can't walk 100 yards even at a slow pace at a flat grade s stopping due to sob     No obvious day to day or daytime variability or assoc excess/ purulent sputum or mucus plugs or hemoptysis or cp or chest tightness, subjective wheeze or overt sinus or hb symptoms. No unusual exposure hx or h/o childhood pna/ asthma or knowledge of premature birth.  Sleeping ok flat without nocturnal  or early am exacerbation  of respiratory  c/o's or need for noct saba. Also denies any obvious fluctuation of symptoms with weather or environmental changes or other aggravating or alleviating factors except as outlined above   Current Allergies, Complete Past Medical History, Past Surgical History, Family History, and Social History were reviewed in [facility] electronic medical record.  ROS  The following are not active complaints unless bolded Hoarseness,  sore throat, dysphagia, dental problems, itching, sneezing,  nasal congestion or discharge of excess mucus or purulent secretions, ear ache,   fever, chills, sweats, unintended wt loss or wt gain, classically pleuritic or exertional cp,  orthopnea pnd or leg swelling, presyncope, palpitations, abdominal pain, anorexia, nausea, vomiting, diarrhea  or change in bowel habits or change in bladder habits, change in stools or change in urine, dysuria, hematuria,  rash, arthralgias, visual complaints, headache, numbness, weakness or ataxia or problems with walking or coordination,  change in mood/affect or memory.        Current Meds  Medication Sig  . budesonide-formoterol (SYMBICORT) 80-4.5 MCG/ACT inhaler Inhale 2 puffs into the lungs 2 (two) times daily.  . cilostazol (PLETAL) 100 MG tablet Take 1 tablet (100 mg total) by mouth 2 (two) times daily before a meal.  . gabapentin (NEURONTIN) 300 MG capsule Take 1-3 capsules (300-900 mg total) at bedtime by mouth.  . hydrochlorothiazide (HYDRODIURIL) 25 MG tablet Take 1 tablet (25 mg total) by mouth daily.  . metoprolol succinate (TOPROL-XL) 25 MG 24 hr tablet Take 1 tablet (25 mg total) by mouth daily.       Review of Systems     Objective:   Physical Exam     Obese pleasant wf nad    Wt Readings from Last 3 Encounters:  06/04/17 196  lb 9.6 oz (89.2 kg)  05/25/17 198 lb 6.4 oz (90 kg)  04/28/17 197 lb 6.4 oz (89.5 kg)     Vital signs reviewed - Note on arrival 02 sats  97% on RA   HEENT: nl   turbinates bilaterally, and oropharynx. Nl external ear canals without cough reflex - very poor dentition especially upper teeth   NECK :  without JVD/Nodes/TM/ nl carotid upstrokes bilaterally   LUNGS: no acc muscle use,  Nl contour chest which is clear to A and P bilaterally without cough on insp or exp maneuvers   CV:  RRR  no s3 or murmur or increase in P2, and no edema   ABD:  soft and nontender with nl inspiratory excursion in the  supine position. No bruits or organomegaly appreciated, bowel sounds nl  MS:  Nl gait/ ext warm without deformities, calf tenderness, cyanosis or clubbing No obvious joint restrictions   SKIN: warm and dry without lesions    NEURO:  alert, approp, nl sensorium with  no motor or cerebellar deficits apparent.      CXR PA and Lateral:   06/04/2017 :    I personally reviewed images and agree with radiology impression as follows:    Increased interstitial lung markings consistent with smoking related bronchitic change.   Labs ordered/ reviewed:      Chemistry      Component Value Date/Time   NA 139 06/04/2017 1711   NA 141 12/09/2016 1450   K 4.3 06/04/2017 1711   CL 105 06/04/2017 1711   CO2 22 06/04/2017 1711   BUN 19 06/04/2017 1711   BUN 12 12/09/2016 1450   CREATININE 0.88 06/04/2017 1711      Component Value Date/Time   CALCIUM 9.0 06/04/2017 1711                             Lab Results  Component Value Date   WBC 14.4 (H) 06/04/2017   HGB 13.6 06/04/2017   HCT 40.5 06/04/2017   MCV 88.2 06/04/2017   PLT 348.0 06/04/2017      Eos                         0.4      06/04/2017   Lab Results  Component Value Date   DDIMER 0.97 (H) 06/04/2017      Lab Results  Component Value Date   TSH 0.63 06/04/2017     Lab Results  Component Value Date   PROBNP 15.0 06/04/2017              Assessment:

## 2017-06-05 ENCOUNTER — Other Ambulatory Visit: Payer: Self-pay | Admitting: Internal Medicine

## 2017-06-05 ENCOUNTER — Encounter: Payer: Self-pay | Admitting: Internal Medicine

## 2017-06-05 DIAGNOSIS — R0600 Dyspnea, unspecified: Secondary | ICD-10-CM

## 2017-06-05 DIAGNOSIS — R0609 Other forms of dyspnea: Principal | ICD-10-CM

## 2017-06-05 HISTORY — DX: Morbid (severe) obesity due to excess calories: E66.01

## 2017-06-05 LAB — RESPIRATORY ALLERGY PANEL REGION II W/ RFLX: ~~LOC~~
Allergen, A. alternata, m6: <0.1 kU/L
Allergen, Cedar tree, t12: <0.1 kU/L
Allergen, Comm Silver Birch, t9: <0.1 kU/L
Allergen, Cottonwood, t14: <0.1 kU/L
Allergen, D pternoyssinus,d7: <0.1 kU/L
Allergen, Mouse Urine Protein, e78: <0.1 kU/L
Allergen, Mulberry, t76: <0.1 kU/L
Allergen, Oak,t7: <0.1 kU/L
Allergen, P. notatum, m1: <0.1 kU/L
Aspergillus fumigatus, m3: <0.1 kU/L
Bermuda Grass: <0.1 kU/L
Box Elder IgE: <0.1 kU/L
CLADOSPORIUM HERBARUM (M2) IGE: <0.1 kU/L
COMMON RAGWEED (SHORT) (W1) IGE: <0.1 kU/L
Cat Dander: <0.1 kU/L
Class: 0
Class: 0
Class: 0
Class: 0
Class: 0
Class: 0
Class: 0
Class: 0
Class: 0
Class: 0
Class: 0
Class: 0
Class: 0
Class: 0
Class: 0
Class: 0
Class: 0
Class: 0
Class: 0
Class: 0
Class: 0
Class: 0
Class: 0
Class: 0
Cockroach: <0.1 kU/L
D. farinae: <0.1 kU/L
Dog Dander: <0.1 kU/L
Elm IgE: <0.1 kU/L
IgE (Immunoglobulin E), Serum: 374 kU/L — ABNORMAL HIGH
Johnson Grass: <0.1 kU/L
Pecan/Hickory Tree IgE: <0.1 kU/L
Rough Pigweed  IgE: <0.1 kU/L
Sheep Sorrel IgE: <0.1 kU/L
Timothy Grass: <0.1 kU/L

## 2017-06-05 LAB — BASIC METABOLIC PANEL
BUN: 19 mg/dL (ref 6–23)
CALCIUM: 9 mg/dL (ref 8.4–10.5)
CO2: 22 mEq/L (ref 19–32)
Chloride: 105 mEq/L (ref 96–112)
Creatinine, Ser: 0.88 mg/dL (ref 0.40–1.20)
GFR: 70.26 mL/min (ref >60.00)
Glucose, Bld: 121 mg/dL — ABNORMAL HIGH (ref 70–99)
Potassium: 4.3 mEq/L (ref 3.5–5.1)
SODIUM: 139 meq/L (ref 135–145)

## 2017-06-05 LAB — BRAIN NATRIURETIC PEPTIDE: Pro B Natriuretic peptide (BNP): 15 pg/mL (ref 0.0–100.0)

## 2017-06-05 LAB — INTERPRETATION:

## 2017-06-05 LAB — TSH: TSH: 0.63 u[IU]/mL (ref 0.35–4.50)

## 2017-06-05 LAB — D-DIMER, QUANTITATIVE (NOT AT ARMC): D DIMER QUANT: 0.97 ug{FEU}/mL — AB (ref <0.50)

## 2017-06-05 NOTE — Assessment & Plan Note (Signed)
Body mass index is 37.15 kg/m.  -    No results found for: TSH   Contributing to gerd risk/ doe/reviewed the need and the process to achieve and maintain neg calorie balance > defer f/u primary care including intermittently monitoring thyroid status

## 2017-06-05 NOTE — Assessment & Plan Note (Addendum)
PFT's   07/19/08  wnl  X slt flattening on insp portion of fv loop  - 06/04/2017  Walked RA x 3 laps @ 185 ft each stopped due to  End of study, fast pace, no  desat  But had to slow down on lap 3 due to sob     Symptoms are markedly disproportionate to objective findings and not clear this is actually much of a  lung problem but pt does appear to have difficult to sort out respiratory symptoms of unknown origin for which  DDX  = almost all start with A and  include Adherence, Ace Inhibitors, Acid Reflux, Active Sinus Disease, Alpha 1 Antitripsin deficiency, Anxiety masquerading as Airways dz,  ABPA,  Allergy(esp in young), Aspiration (esp in elderly), Adverse effects of meds,  Active smokers, A bunch of PE's (a small clot burden can't cause this syndrome unless there is already severe underlying pulm or vascular dz with poor reserve),  Anemia/thyroid dz plus two Bs  = Bronchiectasis and Beta blocker use..and one C= CHF     Adherence is always the initial "prime suspect" and is a multilayered concern that requires a "trust but verify" approach in every patient - starting with knowing how to use medications, especially inhalers, correctly, keeping up with refills and understanding the fundamental difference between maintenance and prns vs those medications only taken for a very short course and then stopped and not refilled.  - reviewed approp hfa technique in case this proves to be related to asthma  - return with all meds in hand using a trust but verify approach to confirm accurate Medication  Reconciliation The principal here is that until we are certain that the  patients are doing what we've asked, it makes no sense to ask them to do more.    Active smoking top of the usual list of suspects (see separate a/p)    ? Anxiety > usually at the bottom of this list of usual suspects but should be much higher on this pt's based on H and P and note already on psychotropics .   ? Allergy/ asthma >  Note  Eos up but neg RAST >  continue symb, return for full pfts  ? Adverse effects of meds > none identified   ? Anemia/ thyroid dz>  Excluded today   ? A bunch of PE's > D dimer nl - while  A nl value  may miss small peripheral pe, the clot burden with sob is moderately high and the d dimer has a very high neg pred value in this setting - unfortunately this is inderdeterminate range and pre test probability is low but not the onset of her sob originally was quite abrupt so it is possible she had remote pe and now has ? ILD so CTa next step > ordered but not urgent based on chronicity of complaints and how good she looked on walk today   ? chf  > excluded with bnp so low     Total time devoted to counseling  > 50 % of initial 60 min office visit:  review case with pt/ discussion of options/alternatives/ personally creating written customized instructions  in presence of pt  then going over those specific  Instructions directly with the pt including how to use all of the meds but in particular covering each new medication in detail and the difference between the maintenance= "automatic" meds and the prns using an action plan format for the latter (If this  problem/symptom => do that organization reading Left to right).  Please see AVS from this visit for a full list of these instructions which I personally wrote for this pt and  are unique to this visit.

## 2017-06-05 NOTE — Progress Notes (Signed)
Spoke with pt and notified of results per Dr. Wert. Pt verbalized understanding and denied any questions. 

## 2017-06-05 NOTE — Assessment & Plan Note (Signed)
>   3 min discussion I reviewed the Fletcher curve with the patient that basically indicates  if you quit smoking when your best day FEV1 is still well preserved (as is probably still   the case here)  it is highly unlikely you will progress to severe disease and informed the patient there was  no medication on the market that has proven to alter the curve/ its downward trajectory  or the likelihood of progression of their disease(unlike other chronic medical conditions such as atheroclerosis where we do think we can change the natural hx with risk reducing meds)    Therefore stopping smoking and maintaining abstinence are  the most important aspects of care, not choice of inhalers or for that matter, doctors.   Treatment other than smoking cessation  is entirely directed by severity of symptoms and focused also on reducing exacerbations, not attempting to change the natural history of the disease.    Will have her return with pfts to see if any of her symptoms are related to copd and if so adjust her meds then accordingly

## 2017-06-08 ENCOUNTER — Telehealth: Payer: Self-pay | Admitting: Internal Medicine

## 2017-06-08 ENCOUNTER — Ambulatory Visit (INDEPENDENT_AMBULATORY_CARE_PROVIDER_SITE_OTHER)
Admission: RE | Admit: 2017-06-08 | Discharge: 2017-06-08 | Disposition: A | Discharge status: Home / Self Care | Payer: 59 | Source: Ambulatory Visit | Attending: Internal Medicine | Admitting: Internal Medicine

## 2017-06-08 DIAGNOSIS — R0609 Other forms of dyspnea: Secondary | ICD-10-CM | POA: Diagnosis not present

## 2017-06-08 DIAGNOSIS — R0600 Dyspnea, unspecified: Secondary | ICD-10-CM

## 2017-06-08 DIAGNOSIS — R7989 Other specified abnormal findings of blood chemistry: Secondary | ICD-10-CM | POA: Diagnosis not present

## 2017-06-08 MED ORDER — IOPAMIDOL (ISOVUE-370) INJECTION 76%
80.0000 mL | Freq: Once | INTRAVENOUS | Status: AC | PRN
  Administered 2017-06-08: 80 mL via INTRAVENOUS

## 2017-06-08 NOTE — Progress Notes (Signed)
Spoke with pt and notified of results per Dr. Wert. Pt verbalized understanding and denied any questions. 

## 2017-06-08 NOTE — Telephone Encounter (Signed)
Advised patient that we will contact her once MW results these.

## 2017-06-15 ENCOUNTER — Other Ambulatory Visit: Payer: Self-pay | Admitting: Internal Medicine

## 2017-06-15 DIAGNOSIS — R0600 Dyspnea, unspecified: Secondary | ICD-10-CM

## 2017-06-22 NOTE — Progress Notes (Signed)
Cardiology Office Note   Date:  06/24/2017   ID:  Robin Keller, DOB 12-03-1960, MRN [id number]  PCP:  Remus Loffler, PA-C  Cardiologist:   No primary care provider on file. Referring:  Remus Loffler, PA-C  Chief Complaint  Patient presents with  . Shortness of Breath      History of Present Illness: Robin Keller is a 57 y.o. female who is referred by Remus Loffler, PA-C for evaluation of dyspnea.    The patient was seen about 9 years ago for palpitations but is not had any cardiac history.  She is been having worsening shortness of breath.  This is happened relatively suddenly by her report over a few months.  She was having leg pain and was found to have some reduced ABIs and is being managed conservatively with exercise, plans for stopping smoking and medications.  She said that after she received this medication she started having some more shortness of breath.  She is not describing PND or orthopnea.  She is not having any palpitations, presyncope or syncope.  She did have a CT of her chest.  She had mild coronary calcium on a recent chest CT.   She has seen Dr. Sherene Sires in more formal pulmonary function testing is planned though she had to cancel this because her family member was ill.  There was also consideration of a high-resolution CT.  She is not describing any chest pressure, neck or arm discomfort.  She does not notice any palpitations, presyncope or syncope.  She is not describing cough or fevers.   Past Medical History:  Diagnosis Date  . Arthralgia of both knees 12/09/2016  . CHEST PAIN, ATYPICAL, HX OF 08/07/2008   Qualifier: Diagnosis of  By: Eden Emms, MD, Harrington Challenger   . Chronic radicular lumbar pain 03/02/2017  . Cigarette smoker 08/07/2008   Qualifier: Diagnosis of  By: Eden Emms, MD, Harrington Challenger   . COPD (chronic obstructive pulmonary disease) (HCC) 05/25/2017  . DOE (dyspnea on exertion) 08/07/2008     PFT's   07/19/08  wnl  X slt flattening on insp  portion of fv loop  - 06/04/2017  Walked RA x 3 laps @ 185 ft each stopped due to  End of study, fast pace, no  desat  But had to slow down on lap 3 due to sob   - Allergy profile  06/04/17  >  Eos 0.4 /  IgE  374 RAST neg  - CTa 06/08/2017  Nl    . Extremity atherosclerosis with intermittent claudication (HCC) 03/02/2017  . HYPERTENSION, BENIGN 08/07/2008   Qualifier: Diagnosis of  By: Eden Emms, MD, Harrington Challenger   . Morbid obesity due to excess calories (HCC) complicated by hbp/ doe 06/05/2017  . Myalgia 12/09/2016  . Pain in joints of both feet 12/09/2016  . Palpitations 05/25/2017  . PULMONARY NODULE 08/07/2008   Qualifier: Diagnosis of  By: Shelle Iron MD, Maree Krabbe     Past Surgical History:  Procedure Laterality Date  . ABDOMINAL HYSTERECTOMY    . BACK SURGERY    . CHOLECYSTECTOMY       Current Outpatient Medications  Medication Sig Dispense Refill  . budesonide-formoterol (SYMBICORT) 80-4.5 MCG/ACT inhaler Inhale 2 puffs into the lungs 2 (two) times daily. 1 Inhaler 1  . cilostazol (PLETAL) 100 MG tablet Take 1 tablet (100 mg total) by mouth 2 (two) times daily before a meal. 60 tablet 11  . gabapentin (NEURONTIN)  300 MG capsule Take 1-3 capsules (300-900 mg total) at bedtime by mouth. 90 capsule 3  . hydrochlorothiazide (HYDRODIURIL) 25 MG tablet Take 1 tablet (25 mg total) by mouth daily. 90 tablet 3  . metoprolol succinate (TOPROL-XL) 25 MG 24 hr tablet Take 1 tablet (25 mg total) by mouth daily. 90 tablet 3   No current facility-administered medications for this visit.     Allergies:   Patient has no known allergies.    Social History:  The patient  reports that she has been smoking cigarettes.  She has a 32.00 pack-year smoking history. she has never used smokeless tobacco. She reports that she does not drink alcohol or use drugs.   Family History:  The patient's family history includes COPD in her maternal grandmother; Diabetes in her father and mother; Heart attack in her  maternal grandmother; Hyperlipidemia in her mother; Hypertension in her mother.    ROS:  Please see the history of present illness.   Otherwise, review of systems are positive for none.   All other systems are reviewed and negative.    PHYSICAL EXAM: VS:  BP 138/78   Pulse (!) 108   Ht 5\' 1"  (1.549 m)   Wt 198 lb (89.8 kg)   BMI 37.41 kg/m  , BMI Body mass index is 37.41 kg/m. GENERAL:  Well appearing HEENT:  Pupils equal round and reactive, fundi not visualized, oral mucosa unremarkable, poor dentition NECK:  No jugular venous distention, waveform within normal limits, carotid upstroke brisk and symmetric, no bruits, no thyromegaly LYMPHATICS:  No cervical, inguinal adenopathy LUNGS:  Clear to auscultation bilaterally BACK:  No CVA tenderness CHEST:  Unremarkable HEART:  PMI not displaced or sustained,S1 and S2 within normal limits, no S3, no S4, no clicks, no rubs, no murmurs ABD:  Flat, positive bowel sounds normal in frequency in pitch, no bruits, no rebound, no guarding, no midline pulsatile mass, no hepatomegaly, no splenomegaly EXT:  2 plus pulses throughout, no edema, no cyanosis no clubbing SKIN:  No rashes no nodules NEURO:  Cranial nerves II through XII grossly intact, motor grossly intact throughout PSYCH:  Cognitively intact, oriented to person place and time    EKG:  EKG is not ordered today. The ekg ordered today demonstrates 06/04/17 sinus tachycardia, rate 100, axis within normal limits, intervals within normal limits, no acute ST-T wave changes.   Recent Labs: 12/09/2016: ALT 18 06/04/2017: BUN 19; Creatinine, Ser 0.88; Hemoglobin 13.6; Platelets 348.0; Potassium 4.3; Pro B Natriuretic peptide (BNP) 15.0; Sodium 139; TSH 0.63    Lipid Panel No results found for: CHOL, TRIG, HDL, CHOLHDL, VLDL, LDLCALC, LDLDIRECT    Wt Readings from Last 3 Encounters:  06/24/17 198 lb (89.8 kg)  06/04/17 196 lb 9.6 oz (89.2 kg)  05/25/17 198 lb 6.4 oz (90 kg)      Other  studies Reviewed: Additional studies/ records that were reviewed today include: Pulmonary studies and chest CT results. Review of the above records demonstrates:  Please see elsewhere in the note.     ASSESSMENT AND PLAN:  DYSPNEA: Given her risk factors and her peripheral vascular disease along with the evidence of coronary calcium is certainly possible that she could have obstructive coronary disease at an etiology of her complaints.  She would not be able to walk on a treadmill but I do think screening with a stress test is indicated.  Therefore, she will have a [employer] Myoview.  TOBACCO: She is on the patches and she plans  to quit smoking.  We talked about this.  HTN:  She needs to keep a BP diary.  Her BP is OK today but she reports it might be high at home.    RISK REDUCTION:  I will try to get a copy of her lipids from Remus Loffler, PA-C.  I will suggest a goal LDL of less than 70.    Current medicines are reviewed at length with the patient today.  The patient does not have concerns regarding medicines.  The following changes have been made:    Labs/ tests ordered today include:   Orders Placed This Encounter  Procedures  . MYOCARDIAL PERFUSION IMAGING     Disposition:   FU with me after the perfusion study and pulmonary work up.     Signed, Rollene Rotunda, MD  06/24/2017 12:34 PM    [facility]

## 2017-06-24 ENCOUNTER — Telehealth: Payer: Self-pay | Admitting: Cardiology

## 2017-06-24 ENCOUNTER — Encounter: Payer: Self-pay | Admitting: Cardiology

## 2017-06-24 ENCOUNTER — Ambulatory Visit: Payer: 59 | Admitting: Cardiology

## 2017-06-24 ENCOUNTER — Telehealth (HOSPITAL_COMMUNITY): Payer: Self-pay | Admitting: *Deleted

## 2017-06-24 VITALS — BP 138/78 | HR 108 | Ht 61.0 in | Wt 198.0 lb

## 2017-06-24 DIAGNOSIS — R931 Abnormal findings on diagnostic imaging of heart and coronary circulation: Secondary | ICD-10-CM | POA: Diagnosis not present

## 2017-06-24 DIAGNOSIS — R0609 Other forms of dyspnea: Secondary | ICD-10-CM | POA: Diagnosis not present

## 2017-06-24 DIAGNOSIS — I10 Essential (primary) hypertension: Secondary | ICD-10-CM | POA: Diagnosis not present

## 2017-06-24 DIAGNOSIS — R0600 Dyspnea, unspecified: Secondary | ICD-10-CM

## 2017-06-24 NOTE — Telephone Encounter (Signed)
Left message on voicemail in reference to upcoming appointment scheduled for 06/26/17. Phone number given for a call back so details instructions can be given. Daneil DolinSharon S Brooks

## 2017-06-24 NOTE — Patient Instructions (Signed)
Medication Instructions:  The current medical regimen is effective;  continue present plan and medications.  Testing/Procedures: Your physician has requested that you have a lexiscan myoview. For further information please visit [email or web address]. Please follow instruction sheet, as given.  Follow-Up: Follow up in 2 months with Dr Dana LionsHochrien.  If you need a refill on your cardiac medications before your next appointment, please call your pharmacy.  Thank you for choosing [facility]!!

## 2017-06-25 ENCOUNTER — Telehealth (HOSPITAL_COMMUNITY): Payer: Self-pay | Admitting: *Deleted

## 2017-06-25 NOTE — Telephone Encounter (Signed)
Patient given detailed instructions per Myocardial Perfusion Study Information Sheet for the test on 06/26/17 at 8:45. Patient notified to arrive 15 minutes early and that it is imperative to arrive on time for appointment to keep from having the test rescheduled.  If you need to cancel or reschedule your appointment, please call the office within 24 hours of your appointment. . Patient verbalized understanding.Daneil DolinSharon S Brooks

## 2017-06-26 ENCOUNTER — Ambulatory Visit (HOSPITAL_COMMUNITY): Payer: 59 | Attending: Internal Medicine

## 2017-06-26 DIAGNOSIS — R0609 Other forms of dyspnea: Secondary | ICD-10-CM | POA: Insufficient documentation

## 2017-06-26 DIAGNOSIS — R0600 Dyspnea, unspecified: Secondary | ICD-10-CM

## 2017-06-26 DIAGNOSIS — I2510 Atherosclerotic heart disease of native coronary artery without angina pectoris: Secondary | ICD-10-CM | POA: Diagnosis not present

## 2017-06-26 DIAGNOSIS — I10 Essential (primary) hypertension: Secondary | ICD-10-CM | POA: Diagnosis not present

## 2017-06-26 MED ORDER — TECHNETIUM TC 99M TETROFOSMIN IV KIT
30.1000 | PACK | Freq: Once | INTRAVENOUS | Status: AC | PRN
  Administered 2017-06-26: 30.1 via INTRAVENOUS
  Filled 2017-06-26: qty 31

## 2017-06-29 ENCOUNTER — Telehealth (HOSPITAL_COMMUNITY): Payer: Self-pay | Admitting: *Deleted

## 2017-06-29 NOTE — Telephone Encounter (Signed)
Left message on voicemail in reference to upcoming appointment scheduled for 07/02/17. Phone number given for a call back so details instructions can be given. Robin Keller W   

## 2017-07-02 ENCOUNTER — Ambulatory Visit (HOSPITAL_COMMUNITY): Payer: 59 | Attending: Internal Medicine

## 2017-07-02 DIAGNOSIS — R0602 Shortness of breath: Secondary | ICD-10-CM | POA: Insufficient documentation

## 2017-07-02 DIAGNOSIS — I10 Essential (primary) hypertension: Secondary | ICD-10-CM | POA: Diagnosis not present

## 2017-07-02 DIAGNOSIS — R0609 Other forms of dyspnea: Secondary | ICD-10-CM | POA: Diagnosis present

## 2017-07-02 LAB — MYOCARDIAL PERFUSION IMAGING
CHL CUP NUCLEAR SDS: 9
CHL CUP NUCLEAR SSS: 18
LHR: 0.44
LV dias vol: 54 mL (ref 46–106)
LV sys vol: 14 mL
Peak HR: 123 {beats}/min
Rest HR: 107 {beats}/min
SRS: 9
TID: 0.93

## 2017-07-02 MED ORDER — TECHNETIUM TC 99M TETROFOSMIN IV KIT
33.0000 | PACK | Freq: Once | INTRAVENOUS | Status: AC | PRN
  Administered 2017-07-02: 33 via INTRAVENOUS
  Filled 2017-07-02: qty 33

## 2017-07-02 MED ORDER — REGADENOSON 0.4 MG/5ML IV SOLN
0.4000 mg | Freq: Once | INTRAVENOUS | Status: AC
Start: 2017-07-02 — End: 2017-07-02
  Administered 2017-07-02: 0.4 mg via INTRAVENOUS

## 2017-07-10 ENCOUNTER — Telehealth: Payer: Self-pay | Admitting: Cardiology

## 2017-07-10 DIAGNOSIS — Z01818 Encounter for other preprocedural examination: Secondary | ICD-10-CM

## 2017-07-10 DIAGNOSIS — R9439 Abnormal result of other cardiovascular function study: Secondary | ICD-10-CM

## 2017-07-10 DIAGNOSIS — R931 Abnormal findings on diagnostic imaging of heart and coronary circulation: Secondary | ICD-10-CM

## 2017-07-10 NOTE — Telephone Encounter (Signed)
Follow Up: ° ° ° °Returning your call from yesterday, concerning her Stress Test results. °

## 2017-07-10 NOTE — Telephone Encounter (Signed)
Pt needs left heart cath.  Aware she will be scheduled for Monday 4/8 (at her request).  [facility] or 3rd.

## 2017-07-13 ENCOUNTER — Encounter: Payer: Self-pay | Admitting: *Deleted

## 2017-07-13 NOTE — Telephone Encounter (Signed)
Spoke with patient about date and time of procedure.  Reviewed instructions in detail but will also mail instructions as well.  [facility].

## 2017-07-13 NOTE — Telephone Encounter (Signed)
Pt scheduled for out pt left heart cath on Monday, 07/27/17 with Dr Herbie BaltimoreHarding.  Will need lab work [facility]. Left message to c/b to discuss instructions and schedule lab.

## 2017-07-20 ENCOUNTER — Ambulatory Visit: Payer: 59 | Admitting: Surgery

## 2017-07-21 ENCOUNTER — Other Ambulatory Visit: Payer: 59

## 2017-07-21 DIAGNOSIS — R9439 Abnormal result of other cardiovascular function study: Secondary | ICD-10-CM

## 2017-07-21 DIAGNOSIS — Z01818 Encounter for other preprocedural examination: Secondary | ICD-10-CM

## 2017-07-21 DIAGNOSIS — R931 Abnormal findings on diagnostic imaging of heart and coronary circulation: Secondary | ICD-10-CM

## 2017-07-21 LAB — CBC
Hematocrit: 42.8 % (ref 34.0–46.6)
Hemoglobin: 14.6 g/dL (ref 11.1–15.9)
MCH: 30.4 pg (ref 26.6–33.0)
MCHC: 34.1 g/dL (ref 31.5–35.7)
MCV: 89 fL (ref 79–97)
PLATELETS: 367 10*3/uL (ref 150–379)
RBC: 4.81 x10E6/uL (ref 3.77–5.28)
RDW: 14 % (ref 12.3–15.4)
WBC: 12.2 10*3/uL — AB (ref 3.4–10.8)

## 2017-07-21 LAB — BASIC METABOLIC PANEL
BUN / CREAT RATIO: 14 (ref 9–23)
BUN: 12 mg/dL (ref 6–24)
CHLORIDE: 101 mmol/L (ref 96–106)
CO2: 18 mmol/L — ABNORMAL LOW (ref 20–29)
Calcium: 9.4 mg/dL (ref 8.7–10.2)
Creatinine, Ser: 0.83 mg/dL (ref 0.57–1.00)
GFR calc non Af Amer: 79 mL/min/{1.73_m2} (ref >59)
GFR, EST AFRICAN AMERICAN: 91 mL/min/{1.73_m2} (ref >59)
GLUCOSE: 177 mg/dL — AB (ref 65–99)
POTASSIUM: 4.1 mmol/L (ref 3.5–5.2)
SODIUM: 139 mmol/L (ref 134–144)

## 2017-07-22 LAB — PROTIME-INR
INR: 1 (ref 0.8–1.2)
Prothrombin Time: 9.9 s (ref 9.1–12.0)

## 2017-07-23 ENCOUNTER — Telehealth: Payer: Self-pay | Admitting: *Deleted

## 2017-07-23 NOTE — Telephone Encounter (Addendum)
Pt contacted pre-catheterization scheduled [facility] for: Monday July 27, 2017 7:30 AM Verified arrival [facility] and [facility] at: 5:30 AM Nothing to eat or drink after midnight prior to cath  Hold: HCTZ AM of cath  Verified AM meds can be  taken pre-cath with sip of water including: ASA 81 mg  Patient  has responsible person to drive home post procedure and observe patient for 24 hours: yes

## 2017-07-26 ENCOUNTER — Other Ambulatory Visit: Payer: Self-pay | Admitting: Cardiology

## 2017-07-26 DIAGNOSIS — R9439 Abnormal result of other cardiovascular function study: Secondary | ICD-10-CM

## 2017-07-27 ENCOUNTER — Ambulatory Visit (HOSPITAL_COMMUNITY)
Admission: RE | Admit: 2017-07-27 | Discharge: 2017-07-27 | Disposition: A | Discharge status: Home / Self Care | Payer: 59 | Source: Ambulatory Visit | Attending: Cardiology | Admitting: Cardiology

## 2017-07-27 ENCOUNTER — Encounter (HOSPITAL_COMMUNITY): Payer: Self-pay | Admitting: Cardiology

## 2017-07-27 ENCOUNTER — Ambulatory Visit (HOSPITAL_COMMUNITY)
Admission: RE | Disposition: A | Discharge status: Home / Self Care | Payer: Self-pay | Source: Ambulatory Visit | Attending: Cardiology

## 2017-07-27 DIAGNOSIS — R9439 Abnormal result of other cardiovascular function study: Secondary | ICD-10-CM | POA: Diagnosis not present

## 2017-07-27 DIAGNOSIS — Z7982 Long term (current) use of aspirin: Secondary | ICD-10-CM | POA: Insufficient documentation

## 2017-07-27 DIAGNOSIS — F17210 Nicotine dependence, cigarettes, uncomplicated: Secondary | ICD-10-CM | POA: Diagnosis not present

## 2017-07-27 DIAGNOSIS — Z79899 Other long term (current) drug therapy: Secondary | ICD-10-CM | POA: Diagnosis not present

## 2017-07-27 DIAGNOSIS — Z6835 Body mass index (BMI) 35.0-35.9, adult: Secondary | ICD-10-CM | POA: Diagnosis not present

## 2017-07-27 DIAGNOSIS — I10 Essential (primary) hypertension: Secondary | ICD-10-CM | POA: Insufficient documentation

## 2017-07-27 DIAGNOSIS — R0609 Other forms of dyspnea: Secondary | ICD-10-CM | POA: Insufficient documentation

## 2017-07-27 DIAGNOSIS — E785 Hyperlipidemia, unspecified: Secondary | ICD-10-CM | POA: Insufficient documentation

## 2017-07-27 DIAGNOSIS — J449 Chronic obstructive pulmonary disease, unspecified: Secondary | ICD-10-CM | POA: Insufficient documentation

## 2017-07-27 DIAGNOSIS — I739 Peripheral vascular disease, unspecified: Secondary | ICD-10-CM | POA: Insufficient documentation

## 2017-07-27 DIAGNOSIS — I2510 Atherosclerotic heart disease of native coronary artery without angina pectoris: Secondary | ICD-10-CM | POA: Diagnosis not present

## 2017-07-27 DIAGNOSIS — R0600 Dyspnea, unspecified: Secondary | ICD-10-CM | POA: Diagnosis present

## 2017-07-27 HISTORY — PX: LEFT HEART CATH AND CORONARY ANGIOGRAPHY: CATH118249

## 2017-07-27 SURGERY — LEFT HEART CATH AND CORONARY ANGIOGRAPHY
Anesthesia: LOCAL

## 2017-07-27 MED ORDER — LIDOCAINE HCL (PF) 1 % IJ SOLN
INTRAMUSCULAR | Status: DC | PRN
  Administered 2017-07-27: 2 mL

## 2017-07-27 MED ORDER — IOHEXOL 350 MG/ML SOLN
INTRAVENOUS | Status: DC | PRN
  Administered 2017-07-27: 50 mL via INTRA_ARTERIAL

## 2017-07-27 MED ORDER — VERAPAMIL HCL 2.5 MG/ML IV SOLN
INTRAVENOUS | Status: AC
  Filled 2017-07-27: qty 2

## 2017-07-27 MED ORDER — SODIUM CHLORIDE 0.9% FLUSH: 3.0000 mL | INTRAVENOUS | Status: DC | PRN

## 2017-07-27 MED ORDER — SODIUM CHLORIDE 0.9% FLUSH: 3.0000 mL | Freq: Two times a day (BID) | INTRAVENOUS | Status: DC

## 2017-07-27 MED ORDER — HEPARIN (PORCINE) IN NACL 2-0.9 UNIT/ML-% IJ SOLN
INTRAMUSCULAR | Status: AC | PRN
  Administered 2017-07-27 (×2): 500 mL via INTRA_ARTERIAL

## 2017-07-27 MED ORDER — FENTANYL CITRATE (PF) 100 MCG/2ML IJ SOLN
INTRAMUSCULAR | Status: DC | PRN
  Administered 2017-07-27: 25 ug via INTRAVENOUS

## 2017-07-27 MED ORDER — ACETAMINOPHEN 325 MG PO TABS: 650.0000 mg | ORAL_TABLET | ORAL | Status: DC | PRN

## 2017-07-27 MED ORDER — HEPARIN (PORCINE) IN NACL 2-0.9 UNIT/ML-% IJ SOLN
INTRAMUSCULAR | Status: DC | PRN
  Administered 2017-07-27: 08:00:00 via INTRA_ARTERIAL

## 2017-07-27 MED ORDER — HEPARIN (PORCINE) IN NACL 2-0.9 UNIT/ML-% IJ SOLN
INTRAMUSCULAR | Status: AC
  Filled 2017-07-27: qty 1000

## 2017-07-27 MED ORDER — LIDOCAINE HCL (PF) 1 % IJ SOLN
INTRAMUSCULAR | Status: AC
  Filled 2017-07-27: qty 30

## 2017-07-27 MED ORDER — HEPARIN SODIUM (PORCINE) 1000 UNIT/ML IJ SOLN
INTRAMUSCULAR | Status: AC
  Filled 2017-07-27: qty 1

## 2017-07-27 MED ORDER — MIDAZOLAM HCL 2 MG/2ML IJ SOLN
INTRAMUSCULAR | Status: AC
  Filled 2017-07-27: qty 2

## 2017-07-27 MED ORDER — FENTANYL CITRATE (PF) 100 MCG/2ML IJ SOLN
INTRAMUSCULAR | Status: AC
  Filled 2017-07-27: qty 2

## 2017-07-27 MED ORDER — SODIUM CHLORIDE 0.9 % IV SOLN: 250.0000 mL | INTRAVENOUS | Status: DC | PRN

## 2017-07-27 MED ORDER — SODIUM CHLORIDE 0.9 % IV SOLN: INTRAVENOUS | Status: DC

## 2017-07-27 MED ORDER — SODIUM CHLORIDE 0.9 % WEIGHT BASED INFUSION
3.0000 mL/kg/h | INTRAVENOUS | Status: AC
  Administered 2017-07-27: 3 mL/kg/h via INTRAVENOUS

## 2017-07-27 MED ORDER — ONDANSETRON HCL 4 MG/2ML IJ SOLN: 4.0000 mg | Freq: Four times a day (QID) | INTRAMUSCULAR | Status: DC | PRN

## 2017-07-27 MED ORDER — HEPARIN SODIUM (PORCINE) 1000 UNIT/ML IJ SOLN
INTRAMUSCULAR | Status: DC | PRN
  Administered 2017-07-27: 4500 [IU] via INTRAVENOUS

## 2017-07-27 MED ORDER — SODIUM CHLORIDE 0.9 % WEIGHT BASED INFUSION: 1.0000 mL/kg/h | INTRAVENOUS | Status: DC

## 2017-07-27 MED ORDER — MIDAZOLAM HCL 2 MG/2ML IJ SOLN
INTRAMUSCULAR | Status: DC | PRN
  Administered 2017-07-27: 1 mg via INTRAVENOUS

## 2017-07-27 SURGICAL SUPPLY — 10 items
BAND ZEPHYR COMPRESS 30 LONG (HEMOSTASIS) ×2 IMPLANT
CATH INFINITI 5 FR JL3.5 (CATHETERS) ×2 IMPLANT
CATH INFINITI JR4 5F (CATHETERS) ×2 IMPLANT
GLIDESHEATH SLEND A-KIT 6F 22G (SHEATH) ×2 IMPLANT
GUIDEWIRE INQWIRE 1.5J.035X260 (WIRE) ×1 IMPLANT
INQWIRE 1.5J .035X260CM (WIRE) ×2
KIT HEART LEFT (KITS) ×2 IMPLANT
PACK CARDIAC CATHETERIZATION (CUSTOM PROCEDURE TRAY) ×2 IMPLANT
TRANSDUCER W/STOPCOCK (MISCELLANEOUS) ×2 IMPLANT
TUBING CIL FLEX 10 FLL-RA (TUBING) ×2 IMPLANT

## 2017-07-27 NOTE — Interval H&P Note (Signed)
History and Physical Interval Note:  07/27/2017 7:34 AM  Robin Keller  has presented today for surgery, with the diagnosis of abnormal stress test.  The various methods of treatment have been discussed with the patient and family. After consideration of risks, benefits and other options for treatment, the patient has consented to  Procedure(s): LEFT HEART CATH AND CORONARY ANGIOGRAPHY (N/A) with possible PERCUTANEOUS CORONARY INTERVENTION as a surgical intervention .  The patient's history has been reviewed, patient examined, no change in status, stable for surgery.  I have reviewed the patient's chart and labs.  Questions were answered to the patient's satisfaction.    Cath Lab Visit (complete for each Cath Lab visit)  Clinical Evaluation Leading to the Procedure:   ACS: No.  Non-ACS:    Anginal Classification: CCS III - with DOE as Anginal Equivalent  Anti-ischemic medical therapy: Maximal Therapy (2 or more classes of medications)  Non-Invasive Test Results: Intermediate-risk stress test findings: cardiac mortality 1-3%/year  Prior CABG: No previous CABG    Bryan Lemmaavid Oriel Rumbold

## 2017-07-27 NOTE — H&P (Signed)
History and Physical Interval Note:  NAME:  Robin Keller   MRN: [id number] DOB:  February 18, 1961   ADMIT DATE: 07/27/2017   07/27/2017 7:23 AM  Robin Keller is a 57 y.o. female seen by Dr. Antoine Poche in consultation for exertional dyspnea.  She has HTN, HLD & obesity with tobacco abuse & COPD as CRFs.  She was evaluated with a Lexiscan Myoview --   Nuclear stress EF: 73%.  No T wave inversion was noted during stress.  There was no ST segment deviation noted during stress.  Defect 1: There is a medium defect of moderate severity.  This is an intermediate risk study.   Medium size, moderate intensity partially reversible apical and apical lateral perfusion defect. This could represent ischemia or possibly shifting breast artifact. LVEF is 73% with normal wall motion. This is an intermediate risk study.   Per Dr. Antoine Poche: "She has risk factors, dyspnea and an abnormal stress test. Resting heart rate is too high for CTA. She will need a cardiac cath. I discussed this with her. Please call to schedule. The patient understands that risks included but are not limited to stroke (1 in 1000), death (1 in 1000), kidney failure [usually temporary] (1 in 500), bleeding (1 in 200), allergic reaction [possibly serious] (1 in 200). The patient understands and agrees to proceed."  Past Medical History:  Diagnosis Date  . Arthralgia of both knees 12/09/2016  . CHEST PAIN, ATYPICAL, HX OF 08/07/2008   Qualifier: Diagnosis of  By: Eden Emms, MD, Harrington Challenger   . Chronic radicular lumbar pain 03/02/2017  . Cigarette smoker 08/07/2008   Qualifier: Diagnosis of  By: Eden Emms, MD, Harrington Challenger   . COPD (chronic obstructive pulmonary disease) (HCC) 05/25/2017  . DOE (dyspnea on exertion) 08/07/2008     PFT's   07/19/08  wnl  X slt flattening on insp portion of fv loop  - 06/04/2017  Walked RA x 3 laps @ 185 ft each stopped due to  End of study, fast pace, no  desat  But had to slow down on lap 3 due to sob    - Allergy profile  06/04/17  >  Eos 0.4 /  IgE  374 RAST neg  - CTa 06/08/2017  Nl    . Extremity atherosclerosis with intermittent claudication (HCC) 03/02/2017  . HYPERTENSION, BENIGN 08/07/2008   Qualifier: Diagnosis of  By: Eden Emms, MD, Harrington Challenger   . Morbid obesity due to excess calories (HCC) complicated by hbp/ doe 06/05/2017  . Myalgia 12/09/2016  . Pain in joints of both feet 12/09/2016  . Palpitations 05/25/2017  . PULMONARY NODULE 08/07/2008   Qualifier: Diagnosis of  By: Shelle Iron MD, Maree Krabbe    Past Surgical History:  Procedure Laterality Date  . ABDOMINAL HYSTERECTOMY    . BACK SURGERY    . CHOLECYSTECTOMY      FAMHx: Family History  Problem Relation Age of Onset  . Hypertension Mother   . Diabetes Mother   . Hyperlipidemia Mother   . Diabetes Father   . COPD Maternal Grandmother   . Heart attack Maternal Grandmother     SOCHx:  reports that she has been smoking cigarettes.  She has a 32.00 pack-year smoking history. She has never used smokeless tobacco. She reports that she does not drink alcohol or use drugs.  ALLERGIES: No Known Allergies  HOME MEDICATIONS: Medications Prior to Admission  Medication Sig Dispense Refill Last Dose  . acetaminophen (TYLENOL) 500 MG  tablet Take 1,000 mg by mouth every 6 (six) hours as needed for moderate pain or headache.   07/26/2017 at Unknown time  . aspirin EC 81 MG tablet Take 81 mg by mouth daily.   07/27/2017 at 0345  . budesonide-formoterol (SYMBICORT) 80-4.5 MCG/ACT inhaler Inhale 2 puffs into the lungs 2 (two) times daily. 1 Inhaler 1 Past Month at Unknown time  . cilostazol (PLETAL) 100 MG tablet Take 1 tablet (100 mg total) by mouth 2 (two) times daily before a meal. 60 tablet 11 07/26/2017 at Unknown time  . gabapentin (NEURONTIN) 300 MG capsule Take 1-3 capsules (300-900 mg total) at bedtime by mouth. (Patient taking differently: Take 900 mg by mouth at bedtime. ) 90 capsule 3 07/26/2017 at Unknown time  . metoprolol  succinate (TOPROL-XL) 25 MG 24 hr tablet Take 1 tablet (25 mg total) by mouth daily. 90 tablet 3 07/26/2017 at Unknown time  . hydrochlorothiazide (HYDRODIURIL) 25 MG tablet Take 1 tablet (25 mg total) by mouth daily. (Patient not taking: Reported on 07/20/2017) 90 tablet 3 Not Taking at Unknown time    PHYSICAL EXAM:Blood pressure 127/74, pulse 91, temperature 98.1 F (36.7 C), temperature source Oral, height 5' (1.524 m), weight 180 lb (81.6 kg), SpO2 97 %. General appearance: alert, cooperative and appears stated age Neck: no adenopathy, no carotid bruit, no JVD and supple, symmetrical, trachea midline Lungs: clear to auscultation bilaterally, normal percussion bilaterally and no obvious W/R/R Heart: regular rate and rhythm, S1, S2 normal, no murmur, click, rub or gallop and normal apical impulse Abdomen: soft, non-tender; bowel sounds normal; no masses,  no organomegaly Extremities: extremities normal, atraumatic, no cyanosis or edema Pulses: 2+ and symmetric Neurologic: Grossly normal   IMPRESSION & PLAN The patients' history has been reviewed, patient examined, no change in status from most recent note, stable for surgery. I have reviewed the patients' chart and labs. Questions were answered to the patient's satisfaction.    Robin SarDebra A Hoban has presented today for surgery, with the diagnosis of chest pain The various methods of treatment have been discussed with the patient and family.   Risks / Complications include, but not limited to: Death, MI, CVA/TIA, VF/VT (with defibrillation), Bradycardia (need for temporary pacer placement), contrast induced nephropathy, bleeding / bruising / hematoma / pseudoaneurysm, vascular or coronary injury (with possible emergent CT or Vascular Surgery), adverse medication reactions, infection.     After consideration of risks, benefits and other options for treatment, the patient has consented to Procedure(s):  LEFT HEART CATHETERIZATION AND CORONARY  ANGIOGRAPHY +/- AD HOC PERCUTANEOUS CORONARY INTERVENTION  as a surgical intervention.   We will proceed with the planned procedure.   Bryan Lemmaavid Marqui Formby, MD [facility] [address] [place], [place]  [id number]  [phone]  07/27/2017 7:23 AM

## 2017-07-27 NOTE — Discharge Instructions (Signed)
**Note Dylin Breeden-identified via Obfuscation** Radial Site Care °Refer to this sheet in the next few weeks. These instructions provide you with information about caring for yourself after your procedure. Your health care provider may also give you more specific instructions. Your treatment has been planned according to current medical practices, but problems sometimes occur. Call your health care provider if you have any problems or questions after your procedure. °What can I expect after the procedure? °After your procedure, it is typical to have the following: °· Bruising at the radial site that usually fades within 1-2 weeks. °· Blood collecting in the tissue (hematoma) that may be painful to the touch. It should usually decrease in size and tenderness within 1-2 weeks. ° °Follow these instructions at home: °· Take medicines only as directed by your health care provider. °· You may shower 24-48 hours after the procedure or as directed by your health care provider. Remove the bandage (dressing) and gently wash the site with plain soap and water. Pat the area dry with a clean towel. Do not rub the site, because this may cause bleeding. °· Do not take baths, swim, or use a hot tub until your health care provider approves. °· Check your insertion site every day for redness, swelling, or drainage. °· Do not apply powder or lotion to the site. °· Do not flex or bend the affected arm for 24 hours or as directed by your health care provider. °· Do not push or pull heavy objects with the affected arm for 24 hours or as directed by your health care provider. °· Do not lift over 10 lb (4.5 kg) for 5 days after your procedure or as directed by your health care provider. °· Ask your health care provider when it is okay to: °? Return to work or school. °? Resume usual physical activities or sports. °? Resume sexual activity. °· Do not drive home if you are discharged the same day as the procedure. Have someone else drive you. °· You may drive 24 hours after the procedure  unless otherwise instructed by your health care provider. °· Do not operate machinery or power tools for 24 hours after the procedure. °· If your procedure was done as an outpatient procedure, which means that you went home the same day as your procedure, a responsible adult should be with you for the first 24 hours after you arrive home. °· Keep all follow-up visits as directed by your health care provider. This is important. °Contact a health care provider if: °· You have a fever. °· You have chills. °· You have increased bleeding from the radial site. Hold pressure on the site. °Get help right away if: °· You have unusual pain at the radial site. °· You have redness, warmth, or swelling at the radial site. °· You have drainage (other than a small amount of blood on the dressing) from the radial site. °· The radial site is bleeding, and the bleeding does not stop after 30 minutes of holding steady pressure on the site. °· Your arm or hand becomes pale, cool, tingly, or numb. °This information is not intended to replace advice given to you by your health care provider. Make sure you discuss any questions you have with your health care provider. °Document Released: 05/10/2010 Document Revised: 09/13/2015 Document Reviewed: 10/24/2013 °Elsevier Interactive Patient Education © 2018 Elsevier Inc. ° °

## 2017-07-28 MED FILL — Heparin Sodium (Porcine) 2 Unit/ML in Sodium Chloride 0.9%: INTRAMUSCULAR | Qty: 1000 | Status: AC

## 2017-08-12 ENCOUNTER — Other Ambulatory Visit: Payer: Self-pay | Admitting: Physician Assistant

## 2017-08-13 NOTE — Telephone Encounter (Signed)
Last seen 05/25/17  [facility] [place]

## 2017-09-01 NOTE — Progress Notes (Signed)
Cardiology Office Note   Date:  09/02/2017   ID:  JAMANI ELEY, DOB 04-26-1960, MRN [id number]  PCP:  Remus Loffler, PA-C  Cardiologist:   Rollene Rotunda, MD Referring:  Remus Loffler, PA-C  Chief Complaint  Patient presents with  . Shortness of Breath      History of Present Illness: Robin Keller is a 57 y.o. female who is referred by Remus Loffler, PA-C for evaluation of dyspnea.  She had a perfusion study with an apical perfusion defect.  She had a cardiac cath with minimal coronary plaque.  BNP was normal.  Her heart rate is down a little bit and she is been trying to walk around her house and on the treadmill.  She still very short of breath with activity.  She is not describing any new chest pressure, neck or arm discomfort.  She is had no new weight gain or edema.   Past Medical History:  Diagnosis Date  . Arthralgia of both knees 12/09/2016  . CHEST PAIN, ATYPICAL, HX OF 08/07/2008   Qualifier: Diagnosis of  By: Eden Emms, MD, Harrington Challenger   . Chronic radicular lumbar pain 03/02/2017  . Cigarette smoker 08/07/2008   Qualifier: Diagnosis of  By: Eden Emms, MD, Harrington Challenger   . COPD (chronic obstructive pulmonary disease) (HCC) 05/25/2017  . DOE (dyspnea on exertion) 08/07/2008     PFT's   07/19/08  wnl  X slt flattening on insp portion of fv loop  - 06/04/2017  Walked RA x 3 laps @ 185 ft each stopped due to  End of study, fast pace, no  desat  But had to slow down on lap 3 due to sob   - Allergy profile  06/04/17  >  Eos 0.4 /  IgE  374 RAST neg  - CTa 06/08/2017  Nl    . Extremity atherosclerosis with intermittent claudication (HCC) 03/02/2017  . HYPERTENSION, BENIGN 08/07/2008   Qualifier: Diagnosis of  By: Eden Emms, MD, Harrington Challenger   . Morbid obesity due to excess calories (HCC) complicated by hbp/ doe 06/05/2017  . Myalgia 12/09/2016  . Pain in joints of both feet 12/09/2016  . Palpitations 05/25/2017  . PULMONARY NODULE 08/07/2008   Qualifier: Diagnosis of   By: Shelle Iron MD, Maree Krabbe     Past Surgical History:  Procedure Laterality Date  . ABDOMINAL HYSTERECTOMY    . BACK SURGERY    . CHOLECYSTECTOMY    . LEFT HEART CATH AND CORONARY ANGIOGRAPHY N/A 07/27/2017   Procedure: LEFT HEART CATH AND CORONARY ANGIOGRAPHY;  Surgeon: Marykay Lex, MD;  Location: [facility] INVASIVE CV LAB;  Service: Cardiovascular;  Laterality: N/A;     Current Outpatient Medications  Medication Sig Dispense Refill  . acetaminophen (TYLENOL) 500 MG tablet Take 1,000 mg by mouth every 6 (six) hours as needed for moderate pain or headache.    Marland Kitchen aspirin EC 81 MG tablet Take 81 mg by mouth daily.    . budesonide-formoterol (SYMBICORT) 80-4.5 MCG/ACT inhaler Inhale 2 puffs into the lungs 2 (two) times daily. 1 Inhaler 1  . cilostazol (PLETAL) 100 MG tablet Take 1 tablet (100 mg total) by mouth 2 (two) times daily before a meal. 60 tablet 11  . gabapentin (NEURONTIN) 300 MG capsule TAKE 1 TO 3 CAPSULES BY MOUTH AT BEDTIME 90 capsule 3  . metoprolol succinate (TOPROL-XL) 25 MG 24 hr tablet Take 1 tablet (25 mg total) by mouth daily. 90 tablet  3   No current facility-administered medications for this visit.     Allergies:   Patient has no known allergies.     ROS:  Please see the history of present illness.   Otherwise, review of systems are positive for none.   All other systems are reviewed and negative.    PHYSICAL EXAM: VS:  BP 130/80   Pulse 88   Ht  (1.549 m)   Wt 193 lb (87.5 kg)   BMI 36.47 kg/m  , BMI Body mass index is 36.47 kg/m.  GENERAL:  Well appearing NECK:  No jugular venous distention, waveform within normal limits, carotid upstroke brisk and symmetric, no bruits, no thyromegaly LUNGS:  Clear to auscultation bilaterally CHEST:  Unremarkable HEART:  PMI not displaced or sustained,S1 and S2 within normal limits, no S3, no S4, no clicks, no rubs, no murmurs ABD:  Flat, positive bowel sounds normal in frequency in pitch, no bruits, no rebound, no  guarding, no midline pulsatile mass, no hepatomegaly, no splenomegaly EXT:  2 plus pulses throughout, no edema, no cyanosis no clubbing    EKG:  EKG not ordered today.    Recent Labs: 12/09/2016: ALT 18 06/04/2017: Pro B Natriuretic peptide (BNP) 15.0; TSH 0.63 07/21/2017: BUN 12; Creatinine, Ser 0.83; Hemoglobin 14.6; Platelets 367; Potassium 4.1; Sodium 139    Lipid Panel No results found for: CHOL, TRIG, HDL, CHOLHDL, VLDL, LDLCALC, LDLDIRECT    Wt Readings from Last 3 Encounters:  09/02/17 193 lb (87.5 kg)  07/27/17 180 lb (81.6 kg)  06/26/17 198 lb (89.8 kg)      Other studies Reviewed: Additional studies/ records that were reviewed today include: Cardiac cath.  Review of the above records demonstrates:  See elsewhere    ASSESSMENT AND PLAN:  DYSPNEA:    Her shortness of breath is unchanged.  I do not see a cardiac etiology.  She has a normal BNP and mild coronary plaque.  At this point the next step per Dr. Sherene Sires was to have pulmonary function testing which had to be canceled because of some family matters.  She is going to reschedule an appointment with him to complete that work-up.  No further cardiac testing at this point.   TOBACCO: Her cigarette use is down and she understands the need to have a quit date.   HTN:   The blood pressure is at target. No change in medications is indicated. We will continue with therapeutic lifestyle changes (TLC).    Current medicines are reviewed at length with the patient today.  The patient does not have concerns regarding medicines.  The following changes have been made:  None  Labs/ tests ordered today include:   None  No orders of the defined types were placed in this encounter.    Disposition:   FU with me as needed.   Signed, Rollene Rotunda, MD  09/02/2017 12:30 PM    [facility]

## 2017-09-02 ENCOUNTER — Ambulatory Visit: Payer: 59 | Admitting: Cardiology

## 2017-09-02 ENCOUNTER — Encounter: Payer: Self-pay | Admitting: Cardiology

## 2017-09-02 VITALS — BP 130/80 | HR 88 | Ht 61.0 in | Wt 193.0 lb

## 2017-09-02 DIAGNOSIS — R0609 Other forms of dyspnea: Secondary | ICD-10-CM | POA: Diagnosis not present

## 2017-09-02 DIAGNOSIS — R0600 Dyspnea, unspecified: Secondary | ICD-10-CM

## 2017-09-02 NOTE — Patient Instructions (Signed)
Medication Instructions:  The current medical regimen is effective;  continue present plan and medications.  Follow-Up: Follow up as needed with Dr Hochrein.  Thank you for choosing [facility]!!     

## 2017-09-30 ENCOUNTER — Encounter: Payer: Self-pay | Admitting: Physician Assistant

## 2017-09-30 ENCOUNTER — Ambulatory Visit: Payer: 59 | Admitting: Physician Assistant

## 2017-09-30 VITALS — BP 119/68 | HR 94 | Temp 97.0°F | Ht 61.0 in | Wt 196.0 lb

## 2017-09-30 DIAGNOSIS — M316 Other giant cell arteritis: Secondary | ICD-10-CM

## 2017-09-30 MED ORDER — PREDNISONE 20 MG PO TABS: 20.0000 mg | ORAL_TABLET | Freq: Three times a day (TID) | ORAL | 0 refills | Status: DC

## 2017-09-30 NOTE — Patient Instructions (Signed)
Temporal Arteritis  Temporal arteritis, also called giant cell arteritis, is a condition that causes arteries to become swollen (inflamed). It usually affects arteries in your head and face, but arteries in any part of the body can become inflamed. Temporal arteritis can cause serious problems, such as bone loss, diabetes, and blindness. What are the causes? The cause is unknown. What increases the risk?  Being older than 50.  Being a woman.  Being Caucasian.  Being of Danish, Swedish, Finnish, Norwegian, or Icelandic ancestry.  Having polymyalgia rheumatica (PMR). What are the signs or symptoms? Some people with temporal arteritis have just one symptom, while other have several symptoms. Most signs and symptoms are related to the head and face. Signs and symptoms may include:  Hard or swollen temples (common). Your temples are the flattened area on either side of your forehead. If your temples are swollen, it may hurt to touch them.  Pain when combing your hair or when laying your head down.  Pain in the jaw when chewing.  Pain in the throat or tongue.  Problems with your vision, such as sudden loss of vision in one eye, or seeing double.  Fever.  Fatigue.  A dry cough.  Pain in the hips and shoulders.  Pain in the arms during exercise.  Depression.  Weight loss.  How is this diagnosed? Your health care provider will ask about your symptoms and do a physical exam. He or she may also perform an eye exam and tests, such as:  A complete blood count.  An erythrocyte sedimentation rate test, also called the sed rate test.  A C-reactive protein (CRP) test.  A tissue sample (biopsy) test.  How is this treated? Temporal arteritis is treated with a type of medicine called a corticosteroid. Vision problems may be treated with additional medicines. You will need to see your health care provider while you are being treated. During follow-up visits, your health care  provider will check for problems by:  Performing blood tests and bone density tests.  Checking your blood pressure and blood sugar.  Follow these instructions at home:  Take medicines only as directed by your health care provider.  Take any vitamins or supplements that your health care provider suggests. These may include vitamin D and calcium, which help keep your bones from becoming weak.  Exercise. Talk with your health care provider about what exercises are okay for you to do. Usually exercises that increase your heart rate (aerobic exercise), such as walking, are recommended. Aerobic exercise helps control your blood pressure and prevent bone loss.  Follow a healthy diet. Include healthy sources of protein, fruits, vegetables, and whole grains in your diet. Following a healthy diet helps prevent bone damage and diabetes. Contact a health care provider if:  Your symptoms get worse.  Your fever, fatigue, headache, weight loss, or pain in your jaw gets worse.  You develop signs of infection, such as fever, swelling, redness, warmth, and tenderness. Get help right away if:  Your vision gets worse.  Your pain does not go away, even after you take pain medicine.  You have chest pain.  You have trouble breathing.  One side of your face or body suddenly becomes weak or numb. This information is not intended to replace advice given to you by your health care provider. Make sure you discuss any questions you have with your health care provider. Document Released: 02/02/2009 Document Revised: 12/06/2015 Document Reviewed: 06/01/2013 Elsevier Interactive Patient Education  2018 Elsevier   Inc.  

## 2017-10-01 LAB — CMP14+EGFR
A/G RATIO: 1.3 (ref 1.2–2.2)
ALK PHOS: 137 IU/L — AB (ref 39–117)
ALT: 11 IU/L (ref 0–32)
AST: 10 IU/L (ref 0–40)
Albumin: 3.9 g/dL (ref 3.5–5.5)
BUN/Creatinine Ratio: 13 (ref 9–23)
BUN: 9 mg/dL (ref 6–24)
Bilirubin Total: <0.2 mg/dL (ref 0.0–1.2)
CHLORIDE: 104 mmol/L (ref 96–106)
CO2: 18 mmol/L — ABNORMAL LOW (ref 20–29)
Calcium: 9 mg/dL (ref 8.7–10.2)
Creatinine, Ser: 0.68 mg/dL (ref 0.57–1.00)
GFR calc Af Amer: 112 mL/min/{1.73_m2} (ref >59)
GFR calc non Af Amer: 97 mL/min/{1.73_m2} (ref >59)
GLOBULIN, TOTAL: 3 g/dL (ref 1.5–4.5)
Glucose: 137 mg/dL — ABNORMAL HIGH (ref 65–99)
POTASSIUM: 4.2 mmol/L (ref 3.5–5.2)
Sodium: 140 mmol/L (ref 134–144)
Total Protein: 6.9 g/dL (ref 6.0–8.5)

## 2017-10-01 LAB — C-REACTIVE PROTEIN: CRP: 14.6 mg/L — AB (ref 0.0–4.9)

## 2017-10-01 LAB — SEDIMENTATION RATE: SED RATE: 37 mm/h (ref 0–40)

## 2017-10-01 NOTE — Progress Notes (Signed)
BP 119/68   Pulse 94   Temp (!) 97 F (36.1 C) (Oral)   Ht 5' 1"  (1.549 m)   Wt 196 lb (88.9 kg)   BMI 37.03 kg/m    Subjective:    Patient ID: Robin Keller, female    DOB: 1960/06/08, 57 y.o.   MRN: [id number]  HPI: Robin Keller is a 57 y.o. female presenting on 09/30/2017 for Headache (had for two weeks) and Cough  Headache at the temples persistent day and night. No fever or chills. No NVD or photophobia, no phonophobia. She had migraines in the past but this is different. Pulsing headache. This patient has had many days of sore throat and postnasal drainage, headache at times and sinus pressure. There is copious drainage at times. Denies any fever at this time. There has been a history of sinus infections in the past.  There is cough at night. It has become more prevalent in recent days.   Past Medical History:  Diagnosis Date  . Arthralgia of both knees 12/09/2016  . CHEST PAIN, ATYPICAL, HX OF 08/07/2008   Qualifier: Diagnosis of  By: Johnsie Cancel, MD, Rona Ravens   . Chronic radicular lumbar pain 03/02/2017  . Cigarette smoker 08/07/2008   Qualifier: Diagnosis of  By: Johnsie Cancel, MD, Rona Ravens   . COPD (chronic obstructive pulmonary disease) ([place]) 05/25/2017  . DOE (dyspnea on exertion) 08/07/2008     PFT's   07/19/08  wnl  X slt flattening on insp portion of fv loop  - 06/04/2017  Walked RA x 3 laps @ 185 ft each stopped due to  End of study, fast pace, no  desat  But had to slow down on lap 3 due to sob   - Allergy profile  06/04/17  >  Eos 0.4 /  IgE  374 RAST neg  - CTa 06/08/2017  Nl    . Extremity atherosclerosis with intermittent claudication ([place]) 03/02/2017  . HYPERTENSION, BENIGN 08/07/2008   Qualifier: Diagnosis of  By: Johnsie Cancel, MD, Rona Ravens   . Morbid obesity due to excess calories ([place]) complicated by hbp/ doe 1/63/8453  . Myalgia 12/09/2016  . Pain in joints of both feet 12/09/2016  . Palpitations 05/25/2017  . PULMONARY NODULE 08/07/2008   Qualifier: Diagnosis of  By: Gwenette Greet MD, Armando Reichert    Relevant past medical, surgical, family and social history reviewed and updated as indicated. Interim medical history since our last visit reviewed. Allergies and medications reviewed and updated. DATA REVIEWED: CHART IN EPIC  Family History reviewed for pertinent findings.  Review of Systems  Constitutional: Positive for fatigue. Negative for activity change, appetite change and chills.  HENT: Positive for congestion, postnasal drip and sore throat.   Eyes: Negative.   Respiratory: Positive for cough. Negative for wheezing.   Cardiovascular: Negative.  Negative for chest pain, palpitations and leg swelling.  Gastrointestinal: Negative.   Genitourinary: Negative.   Musculoskeletal: Negative.   Skin: Negative.   Neurological: Positive for headaches.    Allergies as of 09/30/2017   No Known Allergies     Medication List        Accurate as of 09/30/17 11:59 PM. Always use your most recent med list.          acetaminophen 500 MG tablet Commonly known as:  TYLENOL Take 1,000 mg by mouth every 6 (six) hours as needed for moderate pain or headache.   aspirin EC 81 MG tablet Take 81 mg by  mouth daily.   budesonide-formoterol 80-4.5 MCG/ACT inhaler Commonly known as:  SYMBICORT Inhale 2 puffs into the lungs 2 (two) times daily.   cilostazol 100 MG tablet Commonly known as:  PLETAL Take 1 tablet (100 mg total) by mouth 2 (two) times daily before a meal.   gabapentin 300 MG capsule Commonly known as:  NEURONTIN TAKE 1 TO 3 CAPSULES BY MOUTH AT BEDTIME   hydrochlorothiazide 25 MG tablet Commonly known as:  HYDRODIURIL Take 25 mg by mouth daily.   metoprolol succinate 25 MG 24 hr tablet Commonly known as:  TOPROL-XL Take 1 tablet (25 mg total) by mouth daily.   predniSONE 20 MG tablet Commonly known as:  DELTASONE Take 1 tablet (20 mg total) by mouth 3 (three) times daily.          Objective:    BP 119/68    Pulse 94   Temp (!) 97 F (36.1 C) (Oral)   Ht 5' 1"  (1.549 m)   Wt 196 lb (88.9 kg)   BMI 37.03 kg/m   No Known Allergies  Wt Readings from Last 3 Encounters:  09/30/17 196 lb (88.9 kg)  09/02/17 193 lb (87.5 kg)  07/27/17 180 lb (81.6 kg)    Physical Exam  Constitutional: She is oriented to person, place, and time. She appears well-developed and well-nourished.  HENT:  Head: Normocephalic and atraumatic.    Eyes: Pupils are equal, round, and reactive to light. Conjunctivae and EOM are normal.  Cardiovascular: Normal rate, regular rhythm, normal heart sounds and intact distal pulses.  Pulmonary/Chest: Effort normal and breath sounds normal.  Abdominal: Soft. Bowel sounds are normal.  Neurological: She is alert and oriented to person, place, and time. She has normal reflexes.  Skin: Skin is warm and dry. No rash noted.  Psychiatric: She has a normal mood and affect. Her behavior is normal. Judgment and thought content normal.    Results for orders placed or performed in visit on 09/30/17  CMP14+EGFR  Result Value Ref Range   Glucose 137 (H) 65 - 99 mg/dL   BUN 9 6 - 24 mg/dL   Creatinine, Ser 0.68 0.57 - 1.00 mg/dL   GFR calc non Af Amer 97 >59 mL/min/1.73   GFR calc Af Amer 112 >59 mL/min/1.73   BUN/Creatinine Ratio 13 9 - 23   Sodium 140 134 - 144 mmol/L   Potassium 4.2 3.5 - 5.2 mmol/L   Chloride 104 96 - 106 mmol/L   CO2 18 (L) 20 - 29 mmol/L   Calcium 9.0 8.7 - 10.2 mg/dL   Total Protein 6.9 6.0 - 8.5 g/dL   Albumin 3.9 3.5 - 5.5 g/dL   Globulin, Total 3.0 1.5 - 4.5 g/dL   Albumin/Globulin Ratio 1.3 1.2 - 2.2   Bilirubin Total <0.2 0.0 - 1.2 mg/dL   Alkaline Phosphatase 137 (H) 39 - 117 IU/L   AST 10 0 - 40 IU/L   ALT 11 0 - 32 IU/L  Sedimentation rate  Result Value Ref Range   Sed Rate 37 0 - 40 mm/hr  C-reactive protein  Result Value Ref Range   CRP 14.6 (H) 0.0 - 4.9 mg/L      Assessment & Plan:   1. Temporal arteritis (HCC) - CMP14+EGFR -  Sedimentation rate - C-reactive protein - predniSONE (DELTASONE) 20 MG tablet; Take 1 tablet (20 mg total) by mouth 3 (three) times daily.  Dispense: 45 tablet; Refill: 0   Continue all other maintenance medications as listed above.  Follow up plan: Return in about 2 weeks (around 10/14/2017).  Educational handout given for [place] PA-C [facility] [address]  [place], [place] [address] [phone]   10/01/2017, 9:24 PM

## 2017-10-14 ENCOUNTER — Ambulatory Visit: Payer: 59 | Admitting: Physician Assistant

## 2017-12-17 ENCOUNTER — Other Ambulatory Visit: Payer: Self-pay | Admitting: Physician Assistant

## 2017-12-22 ENCOUNTER — Encounter: Payer: Self-pay | Admitting: Physician Assistant

## 2018-01-14 ENCOUNTER — Ambulatory Visit: Payer: 59 | Admitting: Physician Assistant

## 2018-01-16 ENCOUNTER — Encounter: Payer: Self-pay | Admitting: Physician Assistant

## 2018-01-25 ENCOUNTER — Other Ambulatory Visit: Payer: Self-pay | Admitting: Physician Assistant

## 2018-02-01 ENCOUNTER — Encounter: Payer: Self-pay | Admitting: Family Medicine

## 2018-02-01 ENCOUNTER — Ambulatory Visit: Payer: 59 | Admitting: Family Medicine

## 2018-02-01 ENCOUNTER — Ambulatory Visit: Payer: 59 | Admitting: Physician Assistant

## 2018-02-01 VITALS — BP 121/82 | HR 98 | Temp 97.6°F | Ht 61.0 in | Wt 198.0 lb

## 2018-02-01 DIAGNOSIS — J441 Chronic obstructive pulmonary disease with (acute) exacerbation: Secondary | ICD-10-CM

## 2018-02-01 MED ORDER — METHYLPREDNISOLONE ACETATE 80 MG/ML IJ SUSP
80.0000 mg | Freq: Once | INTRAMUSCULAR | Status: AC
  Administered 2018-02-01: 80 mg via INTRAMUSCULAR

## 2018-02-01 MED ORDER — DOXYCYCLINE HYCLATE 100 MG PO TABS: 100.0000 mg | ORAL_TABLET | Freq: Two times a day (BID) | ORAL | 0 refills | Status: AC

## 2018-02-01 NOTE — Patient Instructions (Signed)
Chronic Obstructive Pulmonary Disease Exacerbation  Chronic obstructive pulmonary disease (COPD) is a common lung problem. In COPD, the flow of air from the lungs is limited. COPD exacerbations are times that breathing gets worse and you need extra treatment. Without treatment they can be life threatening. If they happen often, your lungs can become more damaged. If your COPD gets worse, your doctor may treat you with:  ? Medicines.  ? Oxygen.  ? Different ways to clear your airway, such as using a mask.    Follow these instructions at home:  ? Do not smoke.  ? Avoid tobacco smoke and other things that bother your lungs.  ? If given, take your antibiotic medicine as told. Finish the medicine even if you start to feel better.  ? Only take medicines as told by your doctor.  ? Drink enough fluids to keep your pee (urine) clear or pale yellow (unless your doctor has told you not to).  ? Use a cool mist machine (vaporizer).  ? If you use oxygen or a machine that turns liquid medicine into a mist (nebulizer), continue to use them as told.  ? Keep up with shots (vaccinations) as told by your doctor.  ? Exercise regularly.  ? Eat healthy foods.  ? Keep all doctor visits as told.  Get help right away if:  ? You are very short of breath and it gets worse.  ? You have trouble talking.  ? You have bad chest pain.  ? You have blood in your spit (sputum).  ? You have a fever.  ? You keep throwing up (vomiting).  ? You feel weak, or you pass out (faint).  ? You feel confused.  ? You keep getting worse.  This information is not intended to replace advice given to you by your health care provider. Make sure you discuss any questions you have with your health care provider.  Document Released: 03/27/2011 Document Revised: 09/13/2015 Document Reviewed: 12/10/2012  Elsevier Interactive Patient Education ? 2017 [employer]

## 2018-02-01 NOTE — Progress Notes (Addendum)
Subjective:    Patient ID: Robin Keller, female    DOB: 1960/11/13, 57 y.o.   MRN: [id number]  Chief Complaint:  Cough   HPI: Robin Keller is a 57 y.o. female presenting on 02/01/2018 for Cough  Pt presents today for cough, fever, increased shortness of breath, and increased sputum production. Pt states this started 6 days ago and has become worse. She reports she only had a fever on day 4. Pt states the shortness of breath is worse with exertion and the cough is worse at night. She reports the sputum is thick and white to yellow in color. She has been using her Albuterol inhaler with some relief of symptoms.   Relevant past medical, surgical, family, and social history reviewed and updated as indicated.  Allergies and medications reviewed and updated.   Past Medical History:  Diagnosis Date  . Arthralgia of both knees 12/09/2016  . CHEST PAIN, ATYPICAL, HX OF 08/07/2008   Qualifier: Diagnosis of  By: Eden Emms, MD, Harrington Challenger   . Chronic radicular lumbar pain 03/02/2017  . Cigarette smoker 08/07/2008   Qualifier: Diagnosis of  By: Eden Emms, MD, Harrington Challenger   . COPD (chronic obstructive pulmonary disease) (HCC) 05/25/2017  . DOE (dyspnea on exertion) 08/07/2008     PFT's   07/19/08  wnl  X slt flattening on insp portion of fv loop  - 06/04/2017  Walked RA x 3 laps @ 185 ft each stopped due to  End of study, fast pace, no  desat  But had to slow down on lap 3 due to sob   - Allergy profile  06/04/17  >  Eos 0.4 /  IgE  374 RAST neg  - CTa 06/08/2017  Nl    . Extremity atherosclerosis with intermittent claudication (HCC) 03/02/2017  . HYPERTENSION, BENIGN 08/07/2008   Qualifier: Diagnosis of  By: Eden Emms, MD, Harrington Challenger   . Morbid obesity due to excess calories (HCC) complicated by hbp/ doe 06/05/2017  . Myalgia 12/09/2016  . Pain in joints of both feet 12/09/2016  . Palpitations 05/25/2017  . PULMONARY NODULE 08/07/2008   Qualifier: Diagnosis of  By: Shelle Iron MD, Maree Krabbe       Past Surgical History:  Procedure Laterality Date  . ABDOMINAL HYSTERECTOMY    . BACK SURGERY    . CHOLECYSTECTOMY    . LEFT HEART CATH AND CORONARY ANGIOGRAPHY N/A 07/27/2017   Procedure: LEFT HEART CATH AND CORONARY ANGIOGRAPHY;  Surgeon: Marykay Lex, MD;  [facility]: [facility] INVASIVE CV LAB;  Service: Cardiovascular;  Laterality: N/A;    Social History   Socioeconomic History  . Marital status: Married    Spouse name: Not on file  . Number of children: Not on file  . Years of education: Not on file  . Highest education level: Not on file  Occupational History  . Not on file  Social Needs  . Financial resource strain: Not on file  . Food insecurity:    Worry: Not on file    Inability: Not on file  . Transportation needs:    Medical: Not on file    Non-medical: Not on file  Tobacco Use  . Smoking status: Current Every Day Smoker    Packs/day: 0.30    Years: 32.00    Pack years: 9.60    Types: Cigarettes  . Smokeless tobacco: Never Used  . Tobacco comment: Using patches.   Substance and Sexual Activity  . Alcohol  use: No  . Drug use: No  . Sexual activity: Yes    Birth control/protection: Surgical  Lifestyle  . Physical activity:    Days per week: Not on file    Minutes per session: Not on file  . Stress: Not on file  Relationships  . Social connections:    Talks on phone: Not on file    Gets together: Not on file    Attends religious service: Not on file    Active member of club or organization: Not on file    Attends meetings of clubs or organizations: Not on file    Relationship status: Not on file  . Intimate partner violence:    Fear of current or ex partner: Not on file    Emotionally abused: Not on file    Physically abused: Not on file    Forced sexual activity: Not on file  Other Topics Concern  . Not on file  Social History Narrative  . Not on file    Outpatient Encounter Medications as of 02/01/2018  Medication Sig  . acetaminophen  (TYLENOL) 500 MG tablet Take 1,000 mg by mouth every 6 (six) hours as needed for moderate pain or headache.  Mar[place] Kitchen aspirin EC 81 MG tablet Take 81 mg by mouth daily.  . budesonide-formoterol (SYMBICORT) 80-4.5 MCG/ACT inhaler Inhale 2 puffs into the lungs 2 (two) times daily.  . cilostazol (PLETAL) 100 MG tablet Take 1 tablet (100 mg total) by mouth 2 (two) times daily before a meal.  . doxycycline (VIBRA-TABS) 100 MG tablet Take 1 tablet (100 mg total) by mouth 2 (two) times daily for 7 days. 1 po bid  . gabapentin (NEURONTIN) 300 MG capsule TAKE 1 TO 3 CAPSULES BY MOUTH AT BEDTIME  . hydrochlorothiazide (HYDRODIURIL) 25 MG tablet Take 25 mg by mouth daily.  . metoprolol succinate (TOPROL-XL) 25 MG 24 hr tablet Take 1 tablet (25 mg total) by mouth daily.  . predniSONE (DELTASONE) 20 MG tablet Take 1 tablet (20 mg total) by mouth 3 (three) times daily.   Facility-Administered Encounter Medications as of 02/01/2018  Medication  . methylPREDNISolone acetate (DEPO-MEDROL) injection 80 mg    No Known Allergies  Review of Systems  Constitutional: Positive for chills, fatigue and fever.  HENT: Negative for congestion, sinus pressure and voice change.   Respiratory: Positive for cough, chest tightness, shortness of breath and wheezing. Negative for choking and stridor.   Cardiovascular: Negative for chest pain and palpitations.  Gastrointestinal: Negative for abdominal pain, constipation, diarrhea, nausea and vomiting.  Endocrine: Negative for cold intolerance and heat intolerance.  Musculoskeletal: Negative for myalgias.  Neurological: Negative for dizziness, syncope, weakness and headaches.  All other systems reviewed and are negative.       Objective:    BP 121/82   Pulse 98   Temp 97.6 F (36.4 C)   Ht 5\' 1"  (1.549 m)   Wt 198 lb (89.8 kg)   BMI 37.41 kg/m    Wt Readings from Last 3 Encounters:  02/01/18 198 lb (89.8 kg)  09/30/17 196 lb (88.9 kg)  09/02/17 193 lb (87.5 kg)     Physical Exam  Constitutional: She is oriented to person, place, and time. She appears well-developed and well-nourished. She is cooperative. No distress.  HENT:  Head: Normocephalic.  Right Ear: Hearing, tympanic membrane, external ear and ear canal normal.  Left Ear: Hearing, tympanic membrane, external ear and ear canal normal.  Nose: Nose normal.  Mouth/Throat: Uvula is midline,  oropharynx is clear and moist and mucous membranes are normal.  Neck: Trachea normal and phonation normal. No JVD present. Carotid bruit is not present.  Cardiovascular: Normal rate, regular rhythm and normal heart sounds. Exam reveals no gallop and no friction rub.  No murmur heard. Pulmonary/Chest: Effort normal. She has wheezes (mild) in the right upper field, the right lower field, the left upper field and the left lower field. She has rhonchi (mild) in the right lower field and the left lower field.  Neurological: She is alert and oriented to person, place, and time.  Skin: Skin is warm and dry. Capillary refill takes less than 2 seconds. No cyanosis.  Psychiatric: She has a normal mood and affect. Her behavior is normal. Judgment and thought content normal.  Nursing note and vitals reviewed.       Pertinent labs & imaging results that were available during my care of the patient were reviewed by me and considered in my medical decision making.  Assessment & Plan:  [place] was seen today for cough.  Diagnoses and all orders for this visit:  COPD with acute exacerbation (HCC) Increased cough, shortness of breath, and sputum production. Sputum thick in viscosity.  Smoking cessation encouraged.  Medications as prescribed. Return in 2 weeks for reevaluation or sooner if not improving.  -     doxycycline (VIBRA-TABS) 100 MG tablet; Take 1 tablet (100 mg total) by mouth 2 (two) times daily for 7 days. 1 po bid -     methylPREDNISolone acetate (DEPO-MEDROL) injection 80 mg     Continue all other  maintenance medications.  Follow up plan: Return in about 2 weeks (around 02/15/2018), or if symptoms worsen or fail to improve.  Educational handout given for COPD exacerbation  The above assessment and management plan was discussed with the patient. The patient verbalized understanding of and has agreed to the management plan. Patient is aware to call the clinic if symptoms persist or worsen. Patient is aware when to return to the clinic for a follow-up visit. Patient educated on when it is appropriate to go to the emergency department.   Kari Baars, FNP-C [facility] [phone]

## 2018-03-01 ENCOUNTER — Other Ambulatory Visit: Payer: Self-pay | Admitting: Physician Assistant

## 2018-04-08 ENCOUNTER — Other Ambulatory Visit: Payer: Self-pay | Admitting: Physician Assistant

## 2018-05-10 ENCOUNTER — Other Ambulatory Visit: Payer: Self-pay | Admitting: Physician Assistant

## 2018-05-11 MED ORDER — GABAPENTIN 300 MG PO CAPS: ORAL_CAPSULE | ORAL | 0 refills | Status: DC

## 2018-05-11 NOTE — Telephone Encounter (Signed)
Jones. NTBS 30 days given 04/09/18

## 2018-05-11 NOTE — Addendum Note (Signed)
Addended by: Tamera Punt on: 05/11/2018 09:48 AM   Modules accepted: Orders

## 2018-05-11 NOTE — Telephone Encounter (Signed)
Message left for patient that she needs to be seen.

## 2018-05-14 ENCOUNTER — Other Ambulatory Visit: Payer: Self-pay | Admitting: Physician Assistant

## 2018-05-14 ENCOUNTER — Ambulatory Visit: Payer: 59 | Admitting: Physician Assistant

## 2018-05-14 ENCOUNTER — Ambulatory Visit (INDEPENDENT_AMBULATORY_CARE_PROVIDER_SITE_OTHER): Payer: 59

## 2018-05-14 ENCOUNTER — Encounter: Payer: Self-pay | Admitting: Physician Assistant

## 2018-05-14 VITALS — BP 153/86 | HR 94 | Temp 97.9°F | Ht 61.0 in | Wt 200.4 lb

## 2018-05-14 DIAGNOSIS — M25512 Pain in left shoulder: Secondary | ICD-10-CM

## 2018-05-14 DIAGNOSIS — G8929 Other chronic pain: Secondary | ICD-10-CM

## 2018-05-14 DIAGNOSIS — M25552 Pain in left hip: Secondary | ICD-10-CM | POA: Diagnosis not present

## 2018-05-14 DIAGNOSIS — F5101 Primary insomnia: Secondary | ICD-10-CM

## 2018-05-14 DIAGNOSIS — M791 Myalgia, unspecified site: Secondary | ICD-10-CM | POA: Diagnosis not present

## 2018-05-14 DIAGNOSIS — M316 Other giant cell arteritis: Secondary | ICD-10-CM

## 2018-05-14 DIAGNOSIS — M25561 Pain in right knee: Secondary | ICD-10-CM

## 2018-05-14 DIAGNOSIS — M25562 Pain in left knee: Secondary | ICD-10-CM

## 2018-05-14 DIAGNOSIS — M2550 Pain in unspecified joint: Secondary | ICD-10-CM | POA: Diagnosis not present

## 2018-05-14 DIAGNOSIS — M1711 Unilateral primary osteoarthritis, right knee: Secondary | ICD-10-CM | POA: Diagnosis not present

## 2018-05-14 DIAGNOSIS — M353 Polymyalgia rheumatica: Secondary | ICD-10-CM

## 2018-05-14 DIAGNOSIS — M25511 Pain in right shoulder: Secondary | ICD-10-CM | POA: Diagnosis not present

## 2018-05-14 MED ORDER — PREDNISONE 10 MG PO TABS: 20.0000 mg | ORAL_TABLET | Freq: Every day | ORAL | 2 refills | Status: DC

## 2018-05-14 MED ORDER — TEMAZEPAM 15 MG PO CAPS: 15.0000 mg | ORAL_CAPSULE | Freq: Every evening | ORAL | 2 refills | Status: DC | PRN

## 2018-05-14 NOTE — Patient Instructions (Signed)
Temporal Arteritis  Temporal arteritis is a condition that causes arteries to become inflamed. It usually affects arteries in your head and face, but arteries in any part of the body can become inflamed. The condition is also called giant cell arteritis.  Temporal arteritis can cause serious problems such as blindness. Early treatment can help prevent these problems. What are the causes? The cause of this condition is not known. What increases the risk? The following factors may make you more likely to develop this condition:  Being older than 50.  Being a woman.  Being Caucasian.  Being of Danish, Swedish, Finnish, Norwegian, or Icelandic ancestry.  Having a family history of the condition.  Having a certain condition that causes muscle pain and stiffness (polymyalgia rheumatica, PMR). What are the signs or symptoms? Some people with temporal arteritis have just one symptom, while others have several symptoms. Most symptoms are related to the head and face. These may include:  Headache.  Hard or swollen temples. This is common. Your temples are the areas on either side of your forehead. If your temples are swollen, it may hurt to touch them.  Pain when combing your hair or when laying your head down.  Pain in the jaw when chewing.  Pain in the throat or tongue.  Problems with your vision, such as sudden loss of vision in one eye, or seeing double. Other symptoms may include:  Fever.  Tiredness (fatigue).  A dry cough.  Pain in the hips and shoulders.  Pain in the arms during exercise.  Depression.  Weight loss. How is this diagnosed? This condition may be diagnosed based on:  Your symptoms.  Your medical history.  A physical exam.  Tests, including: ? Blood tests. ? A test in which a tissue sample is removed from an artery so it can be examined (biopsy). ? Imaging tests, such as an ultrasound or MRI. How is this treated? This condition may be treated  with:  A type of medicine to reduce inflammation (corticosteroid).  Medicines to weaken your immune system (immunosuppressants).  Other medicines to treat vision problems. You will need to see your health care provider while you are being treated. The medicines used to treat this condition can increase your risk of problems such as bone loss and diabetes. During follow-up visits, your health care provider will check for problems by:  Doing blood tests and bone density tests.  Checking your blood pressure and blood sugar. Follow these instructions at home: Medicines  Take over-the-counter and prescription medicines only as told by your health care provider.  Take any vitamins or supplements recommended by your health care provider. These may include vitamin D and calcium, which help keep your bones from becoming weak. Eating and drinking   Eat a heart-healthy diet. This may include: ? Eating high-fiber foods, such as fresh fruits and vegetables, whole grains, and beans. ? Eating heart-healthy fats (omega-3 fats), such as fish, flaxseed, and flaxseed oil. ? Limiting foods that are high in saturated fat and cholesterol, such as processed and fried foods, fatty meat, and full-fat dairy. ? Limiting how much salt (sodium) you eat.  Include calcium and vitamin D in your diet. Good sources of calcium and vitamin D include: ? Low-fat dairy products such as milk, yogurt, and cheese. ? Certain fish, such as fresh or canned salmon, tuna, and sardines. ? Products that have calcium and vitamin D added to them (fortified products), such as fortified cereals or juice. General instructions  Exercise.   Talk with your health care provider about what exercises are okay for you. Exercises that increase your heart rate (aerobic exercise), such as walking, are often recommended. Aerobic exercise helps control your blood pressure and prevent bone loss.  Stay up to date on all vaccines as directed by your  health care provider.  Keep all follow-up visits as told by your health care provider. This is important. Contact a health care provider if:  Your symptoms get worse.  You develop signs of infection, such as fever, swelling, redness, warmth, and tenderness. Get help right away if:  You lose your vision.  Your pain does not go away, even after you take medicine.  You have chest pain.  You have trouble breathing.  One side of your face or body suddenly becomes weak or numb. These symptoms may represent a serious problem that is an emergency. Do not wait to see if the symptoms will go away. Get medical help right away. Call your local emergency services (911 in the [place] Do not drive yourself to the hospital. Summary  Temporal arteritis is a condition that causes arteries to become inflamed. It usually affects arteries in your head and face.  This condition can cause serious problems, such as blindness. Treatment can help prevent these problems.  Symptoms may include hard or tender temples, pain in your jaw when chewing, problems with your vision, or pain in your hips and shoulders.  Take over-the-counter and prescription medicines as told by your health care provider. This information is not intended to replace advice given to you by your health care provider. Make sure you discuss any questions you have with your health care provider. Document Released: 02/02/2009 Document Revised: 05/19/2017 Document Reviewed: 05/19/2017 Elsevier Interactive Patient Education  2019 [employer] Polymyalgia Rheumatica Polymyalgia rheumatica (PMR) is an inflammatory disorder that causes aching and stiffness in your muscles and joints. Sometimes, PMR leads to a more dangerous condition (temporal arteritis or giant cell arteritis), which can cause vision loss. What are the causes? The exact cause of PMR is not known. What increases the risk? This condition is more likely to develop  in:  Females.  People who are 59 years of age or older.  Caucasians. What are the signs or symptoms? Pain and stiffness are the main symptoms of PMR. Symptoms may start slowly or suddenly. The symptoms:  May be worse after inactivity and in the morning.  May affect your: ? Hips, buttocks, and thighs. ? Neck, arms, and shoulders. This can make it hard to raise your arms above your head. ? Hands and wrists. Other symptoms include:  Fever.  Tiredness.  Weakness.  Decreased appetite. This may lead to weight loss. How is this diagnosed? This condition is diagnosed with a medical history and physical exam. You may need to see a health care provider who specializes in diseases of the joint, muscles, and bones (rheumatologist). You may also have tests, including:  Blood tests.  X-rays. How is this treated? PMR usually goes away without treatment, but it may take years for that to happen. In the meantime, your health care provider may recommend low-dose steroids to help manage your symptoms of pain and stiffness. Regular exercise and rest will also help your symptoms. Follow these instructions at home:  Take over-the-counter and prescription medicines only as told by your health care provider.  Make sure to get enough rest and sleep.  Eat a healthy and nutritious diet.  Try to exercise most days of the week.  Ask your health care provider what type of exercise is best for you.  Keep all follow-up visits as told by your health are provider. This is important. Contact a health care provider if:  Your symptoms are not controlled with medicine.  You have side effects from steroids. These may include: ? Weight gain. ? Swelling. ? Insomnia. ? Mood changes. ? Bruising. ? High blood sugar readings, if you have diabetes. ? Higher than normal blood pressure readings, if you monitor your blood pressure. Get help right away if:  You develop symptoms of temporal arteritis, such  as: ? A change in vision. ? Severe headache. ? Scalp pain. ? Jaw pain. This information is not intended to replace advice given to you by your health care provider. Make sure you discuss any questions you have with your health care provider. Document Released: 05/15/2004 Document Revised: 09/13/2015 Document Reviewed: 10/18/2014 Elsevier Interactive Patient Education  2019 [employer].

## 2018-05-17 LAB — LYME AB/WESTERN BLOT REFLEX: LYME DISEASE AB, QUANT, IGM: <0.8 index (ref 0.00–0.79)

## 2018-05-17 NOTE — Progress Notes (Signed)
BP (!) 153/86   Pulse 94   Temp 97.9 F (36.6 C) (Oral)   Ht 5\' 1"  (1.549 m)   Wt 200 lb 6.4 oz (90.9 kg)   BMI 37.87 kg/m    Subjective:    Patient ID: Robin Keller, female    DOB: March 22, 1961, 58 y.o.   MRN: [id number]  HPI: Robin Keller is a 58 y.o. female presenting on 05/14/2018 for Hypertension (6 month ) and Rash (legs and abdomen)  This patient comes in for periodic recheck on medications and conditions including hypertension and chronic back pain. She has had continued chronic pain in the hips, knees, shoulder. She has swelling and pain in many areas. She also has pain across the front of her head and in the temples on both sides.   All medications are reviewed today. There are no reports of any problems with the medications. All of the medical conditions are reviewed and updated.  Lab work is reviewed and will be ordered as medically necessary. There are no new problems reported with today's visit.   Past Medical History:  Diagnosis Date  . Arthralgia of both knees 12/09/2016  . CHEST PAIN, ATYPICAL, HX OF 08/07/2008   Qualifier: Diagnosis of  By: Eden Emms, MD, Harrington Challenger   . Chronic radicular lumbar pain 03/02/2017  . Cigarette smoker 08/07/2008   Qualifier: Diagnosis of  By: Eden Emms, MD, Harrington Challenger   . COPD (chronic obstructive pulmonary disease) (HCC) 05/25/2017  . DOE (dyspnea on exertion) 08/07/2008     PFT's   07/19/08  wnl  X slt flattening on insp portion of fv loop  - 06/04/2017  Walked RA x 3 laps @ 185 ft each stopped due to  End of study, fast pace, no  desat  But had to slow down on lap 3 due to sob   - Allergy profile  06/04/17  >  Eos 0.4 /  IgE  374 RAST neg  - CTa 06/08/2017  Nl    . Extremity atherosclerosis with intermittent claudication (HCC) 03/02/2017  . HYPERTENSION, BENIGN 08/07/2008   Qualifier: Diagnosis of  By: Eden Emms, MD, Harrington Challenger   . Morbid obesity due to excess calories (HCC) complicated by hbp/ doe 06/05/2017  . Myalgia  12/09/2016  . Pain in joints of both feet 12/09/2016  . Palpitations 05/25/2017  . PULMONARY NODULE 08/07/2008   Qualifier: Diagnosis of  By: Shelle Iron MD, Maree Krabbe    Relevant past medical, surgical, family and social history reviewed and updated as indicated. Interim medical history since our last visit reviewed. Allergies and medications reviewed and updated. DATA REVIEWED: CHART IN EPIC  Family History reviewed for pertinent findings.  Review of Systems  Constitutional: Negative.  Negative for activity change, fatigue and fever.  HENT: Negative.   Eyes: Negative.   Respiratory: Negative.  Negative for cough.   Cardiovascular: Negative.  Negative for chest pain.  Gastrointestinal: Negative.  Negative for abdominal pain.  Endocrine: Negative.   Genitourinary: Negative.  Negative for dysuria.  Musculoskeletal: Positive for arthralgias, back pain, joint swelling, myalgias and neck pain.  Skin: Positive for rash.  Neurological: Positive for headaches.    Allergies as of 05/14/2018   No Known Allergies     Medication List       Accurate as of May 14, 2018 11:59 PM. Always use your most recent med list.        acetaminophen 500 MG tablet Commonly known as:  TYLENOL Take  1,000 mg by mouth every 6 (six) hours as needed for moderate pain or headache.   aspirin EC 81 MG tablet Take 81 mg by mouth daily.   budesonide-formoterol 80-4.5 MCG/ACT inhaler Commonly known as:  SYMBICORT Inhale 2 puffs into the lungs 2 (two) times daily.   cilostazol 100 MG tablet Commonly known as:  PLETAL Take 1 tablet (100 mg total) by mouth 2 (two) times daily before a meal.   gabapentin 300 MG capsule Commonly known as:  NEURONTIN TAKE 1 TO 3 CAPSULES BY MOUTH AT BEDTIME (NEEDS TO BE SEEN BEFORE NEXT REFILL)   hydrochlorothiazide 25 MG tablet Commonly known as:  HYDRODIURIL Take 25 mg by mouth daily.   metoprolol succinate 25 MG 24 hr tablet Commonly known as:  TOPROL-XL Take 1 tablet (25  mg total) by mouth daily.   predniSONE 10 MG tablet Commonly known as:  DELTASONE Take 2 tablets (20 mg total) by mouth daily with breakfast.   temazepam 15 MG capsule Commonly known as:  RESTORIL Take 1 capsule (15 mg total) by mouth at bedtime as needed for sleep.          Objective:    BP (!) 153/86   Pulse 94   Temp 97.9 F (36.6 C) (Oral)   Ht 5\' 1"  (1.549 m)   Wt 200 lb 6.4 oz (90.9 kg)   BMI 37.87 kg/m   No Known Allergies  Wt Readings from Last 3 Encounters:  05/14/18 200 lb 6.4 oz (90.9 kg)  02/01/18 198 lb (89.8 kg)  09/30/17 196 lb (88.9 kg)    Physical Exam Constitutional:      Appearance: She is well-developed.  HENT:     Head: Normocephalic and atraumatic.     Right Ear: Tympanic membrane, ear canal and external ear normal.     Left Ear: Tympanic membrane, ear canal and external ear normal.     Nose: Nose normal. No rhinorrhea.     Mouth/Throat:     Pharynx: No oropharyngeal exudate or posterior oropharyngeal erythema.  Eyes:     Conjunctiva/sclera: Conjunctivae normal.     Pupils: Pupils are equal, round, and reactive to light.  Neck:     Musculoskeletal: Normal range of motion and neck supple.  Cardiovascular:     Rate and Rhythm: Normal rate and regular rhythm.     Heart sounds: Normal heart sounds.  Pulmonary:     Effort: Pulmonary effort is normal.     Breath sounds: Normal breath sounds.  Abdominal:     General: Bowel sounds are normal.     Palpations: Abdomen is soft.  Musculoskeletal:        General: Swelling present.  Skin:    General: Skin is warm and dry.     Findings: No rash.  Neurological:     Mental Status: She is alert and oriented to person, place, and time.     Deep Tendon Reflexes: Reflexes are normal and symmetric.  Psychiatric:        Behavior: Behavior normal.        Thought Content: Thought content normal.        Judgment: Judgment normal.         Assessment & Plan:   1. Myalgia - Lyme Ab/Western Blot  Reflex  2. Arthralgia, unspecified joint - Lyme Ab/Western Blot Reflex  3. Pain of left hip joint - DG HIP UNILAT W OR W/O PELVIS 2-3 VIEWS LEFT; Future  4. Chronic pain of both knees -  DG Knee Bilateral Standing AP; Future  5. Chronic left shoulder pain - predniSONE (DELTASONE) 10 MG tablet; Take 2 tablets (20 mg total) by mouth daily with breakfast.  Dispense: 60 tablet; Refill: 2  6. Polymyalgia rheumatica (HCC) - predniSONE (DELTASONE) 10 MG tablet; Take 2 tablets (20 mg total) by mouth daily with breakfast.  Dispense: 60 tablet; Refill: 2  7. Temporal arteritis (HCC) - predniSONE (DELTASONE) 10 MG tablet; Take 2 tablets (20 mg total) by mouth daily with breakfast.  Dispense: 60 tablet; Refill: 2  8. Primary insomnia - temazepam (RESTORIL) 15 MG capsule; Take 1 capsule (15 mg total) by mouth at bedtime as needed for sleep.  Dispense: 30 capsule; Refill: 2   Continue all other maintenance medications as listed above.  Follow up plan: Return in about 4 weeks (around 06/11/2018).  Educational handout given for survey  Remus Loffler PA-C [facility] [address]  [place], [place] [address] [phone]   05/17/2018, 11:55 AM

## 2018-06-14 ENCOUNTER — Other Ambulatory Visit: Payer: Self-pay | Admitting: Physician Assistant

## 2018-06-14 ENCOUNTER — Ambulatory Visit: Payer: 59 | Admitting: Physician Assistant

## 2018-06-14 ENCOUNTER — Encounter: Payer: Self-pay | Admitting: Physician Assistant

## 2018-06-14 VITALS — BP 128/85 | HR 94 | Temp 97.1°F | Ht 61.0 in | Wt 200.4 lb

## 2018-06-14 DIAGNOSIS — F5101 Primary insomnia: Secondary | ICD-10-CM | POA: Insufficient documentation

## 2018-06-14 DIAGNOSIS — M1120 Other chondrocalcinosis, unspecified site: Secondary | ICD-10-CM

## 2018-06-14 DIAGNOSIS — G8929 Other chronic pain: Secondary | ICD-10-CM | POA: Diagnosis not present

## 2018-06-14 DIAGNOSIS — M25552 Pain in left hip: Secondary | ICD-10-CM | POA: Diagnosis not present

## 2018-06-14 MED ORDER — TEMAZEPAM 15 MG PO CAPS: 15.0000 mg | ORAL_CAPSULE | Freq: Every evening | ORAL | 5 refills | Status: DC | PRN

## 2018-06-14 MED ORDER — GABAPENTIN 400 MG PO CAPS
ORAL_CAPSULE | ORAL | 5 refills | Status: DC
Start: 2018-06-14 — End: 2018-12-28

## 2018-06-15 ENCOUNTER — Other Ambulatory Visit: Payer: Self-pay | Admitting: Physician Assistant

## 2018-06-15 DIAGNOSIS — G8929 Other chronic pain: Secondary | ICD-10-CM

## 2018-06-15 DIAGNOSIS — M25552 Pain in left hip: Principal | ICD-10-CM

## 2018-06-15 NOTE — Progress Notes (Addendum)
BP 128/85   Pulse 94   Temp (!) 97.1 F (36.2 C) (Oral)   Ht 5\' 1"  (1.549 m)   Wt 200 lb 6.4 oz (90.9 kg)   BMI 37.87 kg/m    Subjective:    Patient ID: Robin Keller, female    DOB: 1960-08-05, 58 y.o.   MRN: [id number]  HPI: Robin Keller is a 58 y.o. female presenting on 06/14/2018 for myalgia (4 week follow up )  She comes in for a recheck on the prednisone treatment of her polyarthralgia condition.  She states that the right knee and upper arms did feel better but her left hip and knee continue to hurt significantly.  X-rays were performed that did not show any significant bony abnormality at the last visit.  We will plan for further work-up of this hip it has been 6 months or more since she has started with this pain.  Past Medical History:  Diagnosis Date  . Arthralgia of both knees 12/09/2016  . CHEST PAIN, ATYPICAL, HX OF 08/07/2008   Qualifier: Diagnosis of  By: Eden Emms, MD, Harrington Challenger   . Chronic radicular lumbar pain 03/02/2017  . Cigarette smoker 08/07/2008   Qualifier: Diagnosis of  By: Eden Emms, MD, Harrington Challenger   . COPD (chronic obstructive pulmonary disease) (HCC) 05/25/2017  . DOE (dyspnea on exertion) 08/07/2008     PFT's   07/19/08  wnl  X slt flattening on insp portion of fv loop  - 06/04/2017  Walked RA x 3 laps @ 185 ft each stopped due to  End of study, fast pace, no  desat  But had to slow down on lap 3 due to sob   - Allergy profile  06/04/17  >  Eos 0.4 /  IgE  374 RAST neg  - CTa 06/08/2017  Nl    . Extremity atherosclerosis with intermittent claudication (HCC) 03/02/2017  . HYPERTENSION, BENIGN 08/07/2008   Qualifier: Diagnosis of  By: Eden Emms, MD, Harrington Challenger   . Morbid obesity due to excess calories (HCC) complicated by hbp/ doe 06/05/2017  . Myalgia 12/09/2016  . Pain in joints of both feet 12/09/2016  . Palpitations 05/25/2017  . PULMONARY NODULE 08/07/2008   Qualifier: Diagnosis of  By: Shelle Iron MD, Maree Krabbe    Relevant past medical,  surgical, family and social history reviewed and updated as indicated. Interim medical history since our last visit reviewed. Allergies and medications reviewed and updated. DATA REVIEWED: CHART IN EPIC  Family History reviewed for pertinent findings.  Review of Systems  Constitutional: Negative.   HENT: Negative.   Eyes: Negative.   Respiratory: Negative.   Gastrointestinal: Negative.   Genitourinary: Negative.   Musculoskeletal: Positive for arthralgias, back pain and myalgias.    Allergies as of 06/14/2018   No Known Allergies     Medication List       Accurate as of June 14, 2018 11:59 PM. Always use your most recent med list.        acetaminophen 500 MG tablet Commonly known as:  TYLENOL Take 1,000 mg by mouth every 6 (six) hours as needed for moderate pain or headache.   aspirin EC 81 MG tablet Take 81 mg by mouth daily.   budesonide-formoterol 80-4.5 MCG/ACT inhaler Commonly known as:  SYMBICORT Inhale 2 puffs into the lungs 2 (two) times daily.   cilostazol 100 MG tablet Commonly known as:  PLETAL Take 1 tablet (100 mg total) by mouth 2 (two) times  daily before a meal.   gabapentin 400 MG capsule Commonly known as:  NEURONTIN TAKE 1 TO 3 CAPSULES BY MOUTH AT BEDTIME (   hydrochlorothiazide 25 MG tablet Commonly known as:  HYDRODIURIL Take 25 mg by mouth daily.   metoprolol succinate 25 MG 24 hr tablet Commonly known as:  TOPROL-XL Take 1 tablet (25 mg total) by mouth daily.   predniSONE 10 MG tablet Commonly known as:  DELTASONE Take 2 tablets (20 mg total) by mouth daily with breakfast.   temazepam 15 MG capsule Commonly known as:  RESTORIL Take 1 capsule (15 mg total) by mouth at bedtime as needed for sleep.          Objective:    BP 128/85   Pulse 94   Temp (!) 97.1 F (36.2 C) (Oral)   Ht 5\' 1"  (1.549 m)   Wt 200 lb 6.4 oz (90.9 kg)   BMI 37.87 kg/m   No Known Allergies  Wt Readings from Last 3 Encounters:  06/14/18 200 lb  6.4 oz (90.9 kg)  05/14/18 200 lb 6.4 oz (90.9 kg)  02/01/18 198 lb (89.8 kg)    Physical Exam Constitutional:      Appearance: She is well-developed.  HENT:     Head: Normocephalic and atraumatic.  Eyes:     Conjunctiva/sclera: Conjunctivae normal.     Pupils: Pupils are equal, round, and reactive to light.  Cardiovascular:     Rate and Rhythm: Normal rate and regular rhythm.     Heart sounds: Normal heart sounds.  Pulmonary:     Effort: Pulmonary effort is normal.     Breath sounds: Normal breath sounds.  Abdominal:     General: Bowel sounds are normal.     Palpations: Abdomen is soft.  Skin:    General: Skin is warm and dry.     Findings: No rash.  Neurological:     Mental Status: She is alert and oriented to person, place, and time.     Deep Tendon Reflexes: Reflexes are normal and symmetric.  Psychiatric:        Behavior: Behavior normal.        Thought Content: Thought content normal.        Judgment: Judgment normal.     Results for orders placed or performed in visit on 05/14/18  Lyme Ab/Western Blot Reflex  Result Value Ref Range   Lyme IgG/IgM Ab <0.91 0.00 - 0.90 ISR   LYME DISEASE AB, QUANT, IGM <0.80 0.00 - 0.79 index      Assessment & Plan:   1. Chronic hip pain, left - gabapentin (NEURONTIN) 400 MG capsule; TAKE 1 TO 3 CAPSULES BY MOUTH AT BEDTIME (  Dispense: 90 capsule; Refill: 5 MRI hip without  2. Primary insomnia - temazepam (RESTORIL) 15 MG capsule; Take 1 capsule (15 mg total) by mouth at bedtime as needed for sleep.  Dispense: 30 capsule; Refill: 5  3. Pseudogout Watch and wait   Continue all other maintenance medications as listed above.  Follow up plan: Return in about 4 weeks (around 07/12/2018).  Educational handout given for survey  Remus Loffler PA-C [facility] [address]  [place], [place] [address] [phone]   06/15/2018, 7:44 AM

## 2018-07-13 ENCOUNTER — Encounter: Payer: Self-pay | Admitting: Physician Assistant

## 2018-07-13 ENCOUNTER — Ambulatory Visit (INDEPENDENT_AMBULATORY_CARE_PROVIDER_SITE_OTHER): Payer: 59 | Admitting: Physician Assistant

## 2018-07-13 ENCOUNTER — Other Ambulatory Visit: Payer: Self-pay

## 2018-07-13 VITALS — BP 143/89 | HR 95 | Temp 97.0°F | Ht 61.0 in | Wt 203.0 lb

## 2018-07-13 DIAGNOSIS — G8929 Other chronic pain: Secondary | ICD-10-CM

## 2018-07-13 DIAGNOSIS — F5101 Primary insomnia: Secondary | ICD-10-CM

## 2018-07-13 DIAGNOSIS — M25552 Pain in left hip: Secondary | ICD-10-CM

## 2018-07-13 DIAGNOSIS — J449 Chronic obstructive pulmonary disease, unspecified: Secondary | ICD-10-CM

## 2018-07-13 MED ORDER — BUDESONIDE-FORMOTEROL FUMARATE 80-4.5 MCG/ACT IN AERO
2.0000 | INHALATION_SPRAY | Freq: Two times a day (BID) | RESPIRATORY_TRACT | 1 refills | Status: DC
Start: 2018-07-13 — End: 2019-08-22

## 2018-07-13 MED ORDER — TEMAZEPAM 15 MG PO CAPS: 15.0000 mg | ORAL_CAPSULE | Freq: Every evening | ORAL | 5 refills | Status: DC | PRN

## 2018-07-13 MED ORDER — ALBUTEROL SULFATE HFA 108 (90 BASE) MCG/ACT IN AERS: 2.0000 | INHALATION_SPRAY | Freq: Four times a day (QID) | RESPIRATORY_TRACT | 0 refills | Status: DC | PRN

## 2018-07-13 MED ORDER — HYDROCODONE-ACETAMINOPHEN 10-325 MG PO TABS: 1.0000 | ORAL_TABLET | Freq: Four times a day (QID) | ORAL | 0 refills | Status: DC | PRN

## 2018-07-13 NOTE — Progress Notes (Signed)
BP (!) 143/89   Pulse 95   Temp (!) 97 F (36.1 C) (Oral)   Ht 5\' 1"  (1.549 m)   Wt 203 lb (92.1 kg)   BMI 38.36 kg/m    Subjective:    Patient ID: Robin Keller, female    DOB: 26-Aug-1960, 58 y.o.   MRN: [id number]  HPI: Robin Keller is a 58 y.o. female presenting on 07/13/2018 for Hip Pain (4 week rck )  This patient comes in for 4-week recheck on her chronic pain related to her hip.  After she took the prednisone for couple months it really did not make any difference.  So she has discontinued. She has started the gabapentin and is building about 1 more.  She does like that this is helping with some of her pain.  She did have a limited amount of type VI-VIII months ago.  She was taking a half a pill a day over the past 2 weeks and it has helped a lot.  She is having to work on one because of the COVID 19 situation.  She works for a company that [employer].  She does need some refills on her COPD medication and her anxiety medication.  She states it has worked very well and she is under good control at this time.  Past Medical History:  Diagnosis Date  . Arthralgia of both knees 12/09/2016  . CHEST PAIN, ATYPICAL, HX OF 08/07/2008   Qualifier: Diagnosis of  By: Eden Emms, MD, Harrington Challenger   . Chronic radicular lumbar pain 03/02/2017  . Cigarette smoker 08/07/2008   Qualifier: Diagnosis of  By: Eden Emms, MD, Harrington Challenger   . COPD (chronic obstructive pulmonary disease) (HCC) 05/25/2017  . DOE (dyspnea on exertion) 08/07/2008     PFT's   07/19/08  wnl  X slt flattening on insp portion of fv loop  - 06/04/2017  Walked RA x 3 laps @ 185 ft each stopped due to  End of study, fast pace, no  desat  But had to slow down on lap 3 due to sob   - Allergy profile  06/04/17  >  Eos 0.4 /  IgE  374 RAST neg  - CTa 06/08/2017  Nl    . Extremity atherosclerosis with intermittent claudication (HCC) 03/02/2017  . HYPERTENSION, BENIGN 08/07/2008   Qualifier: Diagnosis of  By: Eden Emms, MD, Harrington Challenger   . Morbid obesity due to excess calories (HCC) complicated by hbp/ doe 06/05/2017  . Myalgia 12/09/2016  . Pain in joints of both feet 12/09/2016  . Palpitations 05/25/2017  . PULMONARY NODULE 08/07/2008   Qualifier: Diagnosis of  By: Shelle Iron MD, Maree Krabbe    Relevant past medical, surgical, family and social history reviewed and updated as indicated. Interim medical history since our last visit reviewed. Allergies and medications reviewed and updated. DATA REVIEWED: CHART IN EPIC  Family History reviewed for pertinent findings.  Review of Systems  Constitutional: Negative.  Negative for activity change, fatigue and fever.  HENT: Negative.   Eyes: Negative.   Respiratory: Negative.  Negative for cough.   Cardiovascular: Negative.  Negative for chest pain.  Gastrointestinal: Negative.  Negative for abdominal pain.  Endocrine: Negative.   Genitourinary: Negative.  Negative for dysuria.  Musculoskeletal: Positive for arthralgias and myalgias.  Skin: Negative.   Neurological: Negative.   Psychiatric/Behavioral: Negative for sleep disturbance.    Allergies as of 07/13/2018   No Known Allergies  Medication List       Accurate as of July 13, 2018 11:01 AM. Always use your most recent med list.        acetaminophen 500 MG tablet Commonly known as:  TYLENOL Take 1,000 mg by mouth every 6 (six) hours as needed for moderate pain or headache.   albuterol 108 (90 Base) MCG/ACT inhaler Commonly known as:  PROVENTIL HFA;VENTOLIN HFA Inhale 2 puffs into the lungs every 6 (six) hours as needed for wheezing or shortness of breath.   aspirin EC 81 MG tablet Take 81 mg by mouth daily.   budesonide-formoterol 80-4.5 MCG/ACT inhaler Commonly known as:  SYMBICORT Inhale 2 puffs into the lungs 2 (two) times daily.   cilostazol 100 MG tablet Commonly known as:  PLETAL Take 1 tablet (100 mg total) by mouth 2 (two) times daily before a meal.   gabapentin 400 MG capsule Commonly  known as:  NEURONTIN TAKE 1 TO 3 CAPSULES BY MOUTH AT BEDTIME (   hydrochlorothiazide 25 MG tablet Commonly known as:  HYDRODIURIL Take 25 mg by mouth daily.   HYDROcodone-acetaminophen 10-325 MG tablet Commonly known as:  NORCO Take 1 tablet by mouth every 6 (six) hours as needed.   metoprolol succinate 25 MG 24 hr tablet Commonly known as:  TOPROL-XL Take 1 tablet (25 mg total) by mouth daily.   temazepam 15 MG capsule Commonly known as:  Restoril Take 1 capsule (15 mg total) by mouth at bedtime as needed for sleep.          Objective:    BP (!) 143/89   Pulse 95   Temp (!) 97 F (36.1 C) (Oral)   Ht  (1.549 m)   Wt 203 lb (92.1 kg)   BMI 38.36 kg/m   No Known Allergies  Wt Readings from Last 3 Encounters:  07/13/18 203 lb (92.1 kg)  06/14/18 200 lb 6.4 oz (90.9 kg)  05/14/18 200 lb 6.4 oz (90.9 kg)    Physical Exam Constitutional:      Appearance: She is well-developed.  HENT:     Head: Normocephalic and atraumatic.  Eyes:     Conjunctiva/sclera: Conjunctivae normal.     Pupils: Pupils are equal, round, and reactive to light.  Cardiovascular:     Rate and Rhythm: Normal rate and regular rhythm.     Heart sounds: Normal heart sounds.  Pulmonary:     Effort: Pulmonary effort is normal.     Breath sounds: Normal breath sounds.  Abdominal:     General: Bowel sounds are normal.     Palpations: Abdomen is soft.  Musculoskeletal:     Left hip: She exhibits decreased range of motion and decreased strength.  Skin:    General: Skin is warm and dry.     Findings: No rash.  Neurological:     Mental Status: She is alert and oriented to person, place, and time.     Deep Tendon Reflexes: Reflexes are normal and symmetric.  Psychiatric:        Behavior: Behavior normal.        Thought Content: Thought content normal.        Judgment: Judgment normal.         Assessment & Plan:   1. Chronic obstructive pulmonary disease, unspecified COPD type (HCC)  - budesonide-formoterol (SYMBICORT) 80-4.5 MCG/ACT inhaler; Inhale 2 puffs into the lungs 2 (two) times daily.  Dispense: 1 Inhaler; Refill: 1 - albuterol (PROVENTIL HFA;VENTOLIN HFA) 108 (90 Base)  MCG/ACT inhaler; Inhale 2 puffs into the lungs every 6 (six) hours as needed for wheezing or shortness of breath.  Dispense: 1 Inhaler; Refill: 0  2. Primary insomnia - temazepam (RESTORIL) 15 MG capsule; Take 1 capsule (15 mg total) by mouth at bedtime as needed for sleep.  Dispense: 30 capsule; Refill: 5  3. Chronic hip pain, left - HYDROcodone-acetaminophen (NORCO) 10-325 MG tablet; Take 1 tablet by mouth every 6 (six) hours as needed.  Dispense: 40 tablet; Refill: 0   Continue all other maintenance medications as listed above.  Follow up plan: No follow-ups on file.  Educational handout given for survey  Remus Loffler PA-C [facility] [address]  [place], [place] [address] [phone]   07/13/2018, 11:01 AM

## 2018-07-14 ENCOUNTER — Ambulatory Visit: Payer: 59 | Admitting: Physician Assistant

## 2018-08-02 ENCOUNTER — Encounter: Payer: Self-pay | Admitting: Physician Assistant

## 2018-08-05 ENCOUNTER — Telehealth: Payer: 59 | Admitting: Family

## 2018-08-05 DIAGNOSIS — J019 Acute sinusitis, unspecified: Secondary | ICD-10-CM | POA: Diagnosis not present

## 2018-08-05 DIAGNOSIS — B9689 Other specified bacterial agents as the cause of diseases classified elsewhere: Secondary | ICD-10-CM | POA: Diagnosis not present

## 2018-08-05 MED ORDER — AMOXICILLIN-POT CLAVULANATE 875-125 MG PO TABS: 1.0000 | ORAL_TABLET | Freq: Two times a day (BID) | ORAL | 0 refills | Status: DC

## 2018-08-05 NOTE — Progress Notes (Signed)

## 2018-09-06 ENCOUNTER — Ambulatory Visit (INDEPENDENT_AMBULATORY_CARE_PROVIDER_SITE_OTHER): Payer: 59 | Admitting: Family Medicine

## 2018-09-06 ENCOUNTER — Encounter: Payer: Self-pay | Admitting: Family Medicine

## 2018-09-06 ENCOUNTER — Other Ambulatory Visit: Payer: Self-pay

## 2018-09-06 DIAGNOSIS — J441 Chronic obstructive pulmonary disease with (acute) exacerbation: Secondary | ICD-10-CM

## 2018-09-06 MED ORDER — LEVOFLOXACIN 500 MG PO TABS: 500.0000 mg | ORAL_TABLET | Freq: Every day | ORAL | 0 refills | Status: AC

## 2018-09-06 MED ORDER — PREDNISONE 20 MG PO TABS: 40.0000 mg | ORAL_TABLET | Freq: Every day | ORAL | 0 refills | Status: AC

## 2018-09-06 MED ORDER — BENZONATATE 100 MG PO CAPS: 100.0000 mg | ORAL_CAPSULE | Freq: Three times a day (TID) | ORAL | 0 refills | Status: DC | PRN

## 2018-09-06 NOTE — Progress Notes (Signed)
Virtual Visit via telephone Note Due to COVID-19, visit is conducted virtually and was requested by patient. This visit type was conducted due to national recommendations for restrictions regarding the COVID-19 Pandemic (e.g. social distancing) in an effort to limit this patient's exposure and mitigate transmission in our community. All issues noted in this document were discussed and addressed.  A physical exam was not performed with this format.   I connected with Robin Keller on 09/06/18 at 0850 by telephone and verified that I am speaking with the correct person using two identifiers. AELA BOHAN is currently located at home and family is currently with them during visit. The provider, Kari Baars, FNP is located in their office at time of visit.  I discussed the limitations, risks, security and privacy concerns of performing an evaluation and management service by telephone and the availability of in person appointments. I also discussed with the patient that there may be a patient responsible charge related to this service. The patient expressed understanding and agreed to proceed.  Subjective:  Patient ID: Robin Keller, female    DOB: [birth year], 59 y.o.   MRN: [id number]  Chief Complaint:  Cough   HPI: Robin Keller is a 58 y.o. female presenting on 09/06/2018 for Cough   Pt reports an increase in shortness of breath, cough, and sputum production over the last 3 days. States she has chills and a low grade fever with the symptoms. States she has been using her albuterol inhaler and Symbicort as prescribed. She denies chest pain. She does have increased fatigue. No known sick contacts or recent travel.   Cough  This is a recurrent problem. The current episode started in the past 7 days. The problem has been gradually worsening. The problem occurs every few minutes. The cough is productive of purulent sputum. Associated symptoms include chills, a fever, shortness of breath and  wheezing. Pertinent negatives include no chest pain, ear congestion, ear pain, headaches, heartburn, hemoptysis, myalgias, nasal congestion, postnasal drip, rash, rhinorrhea, sore throat, sweats or weight loss. The symptoms are aggravated by exercise (exertion). She has tried a beta-agonist inhaler, OTC cough suppressant and steroid inhaler for the symptoms. The treatment provided mild relief. Her past medical history is significant for bronchitis and COPD.     Relevant past medical, surgical, family, and social history reviewed and updated as indicated.  Allergies and medications reviewed and updated.   Past Medical History:  Diagnosis Date  . Arthralgia of both knees 12/09/2016  . CHEST PAIN, ATYPICAL, HX OF 08/07/2008   Qualifier: Diagnosis of  By: Eden Emms, MD, Harrington Challenger   . Chronic radicular lumbar pain 03/02/2017  . Cigarette smoker 08/07/2008   Qualifier: Diagnosis of  By: Eden Emms, MD, Harrington Challenger   . COPD (chronic obstructive pulmonary disease) (HCC) 05/25/2017  . DOE (dyspnea on exertion) 08/07/2008     PFT's   07/19/08  wnl  X slt flattening on insp portion of fv loop  - 06/04/2017  Walked RA x 3 laps @ 185 ft each stopped due to  End of study, fast pace, no  desat  But had to slow down on lap 3 due to sob   - Allergy profile  06/04/17  >  Eos 0.4 /  IgE  374 RAST neg  - CTa 06/08/2017  Nl    . Extremity atherosclerosis with intermittent claudication (HCC) 03/02/2017  . HYPERTENSION, BENIGN 08/07/2008   Qualifier: Diagnosis of  By: Eden Emms, MD, [facility],  Lacy DuverneyPeter Charles   . Morbid obesity due to excess calories (HCC) complicated by hbp/ doe 06/05/2017  . Myalgia 12/09/2016  . Pain in joints of both feet 12/09/2016  . Palpitations 05/25/2017  . PULMONARY NODULE 08/07/2008   Qualifier: Diagnosis of  By: Shelle Ironlance MD, Maree KrabbeKeith M     Past Surgical History:  Procedure Laterality Date  . ABDOMINAL HYSTERECTOMY    . BACK SURGERY    . CHOLECYSTECTOMY    . LEFT HEART CATH AND CORONARY  ANGIOGRAPHY N/A 07/27/2017   Procedure: LEFT HEART CATH AND CORONARY ANGIOGRAPHY;  Surgeon: Marykay LexHarding, David W, MD;  [facility];  Service: Cardiovascular;  Laterality: N/A;    Social History   Socioeconomic History  . Marital status: Married    Spouse name: Not on file  . Number of children: Not on file  . Years of education: Not on file  . Highest education level: Not on file  Occupational History  . Not on file  Social Needs  . Financial resource strain: Not on file  . Food insecurity:    Worry: Not on file    Inability: Not on file  . Transportation needs:    Medical: Not on file    Non-medical: Not on file  Tobacco Use  . Smoking status: Current Every Day Smoker    Packs/day: 0.30    Years: 32.00    Pack years: 9.60    Types: Cigarettes  . Smokeless tobacco: Never Used  . Tobacco comment: Using patches.   Substance and Sexual Activity  . Alcohol use: No  . Drug use: No  . Sexual activity: Yes    Birth control/protection: Surgical  Lifestyle  . Physical activity:    Days per week: Not on file    Minutes per session: Not on file  . Stress: Not on file  Relationships  . Social connections:    Talks on phone: Not on file    Gets together: Not on file    Attends religious service: Not on file    Active member of club or organization: Not on file    Attends meetings of clubs or organizations: Not on file    Relationship status: Not on file  . Intimate partner violence:    Fear of current or ex partner: Not on file    Emotionally abused: Not on file    Physically abused: Not on file    Forced sexual activity: Not on file  Other Topics Concern  . Not on file  Social History Narrative  . Not on file    Outpatient Encounter Medications as of 09/06/2018  Medication Sig  . acetaminophen (TYLENOL) 500 MG tablet Take 1,000 mg by mouth every 6 (six) hours as needed for moderate pain or headache.  . albuterol (PROVENTIL HFA;VENTOLIN HFA) 108 (90 Base)  MCG/ACT inhaler Inhale 2 puffs into the lungs every 6 (six) hours as needed for wheezing or shortness of breath.  Marland Kitchen. aspirin EC 81 MG tablet Take 81 mg by mouth daily.  . benzonatate (TESSALON PERLES) 100 MG capsule Take 1 capsule (100 mg total) by mouth 3 (three) times daily as needed for cough.  . budesonide-formoterol (SYMBICORT) 80-4.5 MCG/ACT inhaler Inhale 2 puffs into the lungs 2 (two) times daily.  . cilostazol (PLETAL) 100 MG tablet Take 1 tablet (100 mg total) by mouth 2 (two) times daily before a meal.  . gabapentin (NEURONTIN) 400 MG capsule TAKE 1 TO 3 CAPSULES BY MOUTH AT BEDTIME (  .  hydrochlorothiazide (HYDRODIURIL) 25 MG tablet Take 25 mg by mouth daily.  Marland Kitchen HYDROcodone-acetaminophen (NORCO) 10-325 MG tablet Take 1 tablet by mouth every 6 (six) hours as needed.  Marland Kitchen levofloxacin (LEVAQUIN) 500 MG tablet Take 1 tablet (500 mg total) by mouth daily for 5 days.  . metoprolol succinate (TOPROL-XL) 25 MG 24 hr tablet Take 1 tablet (25 mg total) by mouth daily.  . predniSONE (DELTASONE) 20 MG tablet Take 2 tablets (40 mg total) by mouth daily with breakfast for 5 days.  . temazepam (RESTORIL) 15 MG capsule Take 1 capsule (15 mg total) by mouth at bedtime as needed for sleep.  . [DISCONTINUED] amoxicillin-clavulanate (AUGMENTIN) 875-125 MG tablet Take 1 tablet by mouth 2 (two) times daily.   No facility-administered encounter medications on file as of 09/06/2018.     No Known Allergies  Review of Systems  Constitutional: Positive for activity change, chills, fatigue and fever. Negative for appetite change, diaphoresis, unexpected weight change and weight loss.  HENT: Negative for congestion, ear pain, postnasal drip, rhinorrhea and sore throat.   Respiratory: Positive for cough, shortness of breath and wheezing. Negative for hemoptysis, chest tightness and stridor.   Cardiovascular: Negative for chest pain, palpitations and leg swelling.  Gastrointestinal: Negative for heartburn.   Genitourinary: Negative for decreased urine volume and difficulty urinating.  Musculoskeletal: Negative for arthralgias and myalgias.  Skin: Negative for rash.  Neurological: Negative for dizziness, syncope, weakness, light-headedness and headaches.  Psychiatric/Behavioral: Negative for confusion.  All other systems reviewed and are negative.        Observations/Objective: No vital signs or physical exam, this was a telephone or virtual health encounter.  Pt alert and oriented, answers all questions appropriately, and able to speak in full sentences.    Assessment and Plan: Shiri was seen today for cough.  Diagnoses and all orders for this visit:  COPD with acute exacerbation (HCC) Reported symptoms consistent with COPD exacerbation and concerning for CAP. Pt has recently been on Augmentin, so will prescribed Levaquin today. Continue inhalers as prescribed. Increase water intake. Initiate over the couter Mucinex and Delsym. Will do five day course of oral steroids. Tessalon perles as needed for cough. Report any new or worsening symptoms. Follow up in 1 week. -     levofloxacin (LEVAQUIN) 500 MG tablet; Take 1 tablet (500 mg total) by mouth daily for 5 days. -     benzonatate (TESSALON PERLES) 100 MG capsule; Take 1 capsule (100 mg total) by mouth 3 (three) times daily as needed for cough. -     predniSONE (DELTASONE) 20 MG tablet; Take 2 tablets (40 mg total) by mouth daily with breakfast for 5 days.     Follow Up Instructions: Return in about 1 week (around 09/13/2018), or if symptoms worsen or fail to improve, for COPD exacerbation.    I discussed the assessment and treatment plan with the patient. The patient was provided an opportunity to ask questions and all were answered. The patient agreed with the plan and demonstrated an understanding of the instructions.   The patient was advised to call back or seek an in-person evaluation if the symptoms worsen or if the condition  fails to improve as anticipated.  The above assessment and management plan was discussed with the patient. The patient verbalized understanding of and has agreed to the management plan. Patient is aware to call the clinic if symptoms persist or worsen. Patient is aware when to return to the clinic for a follow-up visit.  Patient educated on when it is appropriate to go to the emergency department.    I provided 15 minutes of non-face-to-face time during this encounter. The call started at 0850. The call ended at 0905. The other time was used for coordination of care.    Kari Baars, FNP-C [facility] [address] [place], [place] [address] [phone]

## 2018-09-14 ENCOUNTER — Encounter: Payer: Self-pay | Admitting: Family Medicine

## 2018-09-14 ENCOUNTER — Other Ambulatory Visit: Payer: Self-pay

## 2018-09-14 ENCOUNTER — Ambulatory Visit (INDEPENDENT_AMBULATORY_CARE_PROVIDER_SITE_OTHER): Payer: 59 | Admitting: Family Medicine

## 2018-09-14 DIAGNOSIS — J449 Chronic obstructive pulmonary disease, unspecified: Secondary | ICD-10-CM | POA: Diagnosis not present

## 2018-09-14 NOTE — Progress Notes (Signed)
Virtual Visit via telephone Note Due to COVID-19, visit is conducted virtually and was requested by patient. This visit type was conducted due to national recommendations for restrictions regarding the COVID-19 Pandemic (e.g. social distancing) in an effort to limit this patient's exposure and mitigate transmission in our community. All issues noted in this document were discussed and addressed.  A physical exam was not performed with this format.   I connected with Robin Keller on 09/14/18 at 0850 by telephone and verified that I am speaking with the correct person using two identifiers. Robin Keller is currently located at home and family is currently with them during visit. The provider, Kari Baars, FNP is located in their office at time of visit.  I discussed the limitations, risks, security and privacy concerns of performing an evaluation and management service by telephone and the availability of in person appointments. I also discussed with the patient that there may be a patient responsible charge related to this service. The patient expressed understanding and agreed to proceed.  Subjective:  Patient ID: Robin Keller, female    DOB: [birth year], 58 y.o.   MRN: [id number]  Chief Complaint:  COPD   HPI: Robin Keller is a 58 y.o. female presenting on 09/14/2018 for COPD   Pt is following up today for COPD exacerbation and possible CAP. Pt was placed on Levaquin, prednisone, and tessalon perles on 09/06/2018. Pt states she has completed her medications and feels much better. States her cough and exertional shortness of breath are minimal. She denies fever, chills, fatigue, or confusion. No other associated symptoms.   COPD  She complains of cough, shortness of breath and sputum production. There is no chest tightness, difficulty breathing, frequent throat clearing, hemoptysis, hoarse voice or wheezing. This is a chronic problem. The current episode started more than 1 year ago.  The problem occurs intermittently. The problem has been rapidly improving. The cough is productive. Associated symptoms include dyspnea on exertion (minimal). Pertinent negatives include no appetite change, chest pain, ear congestion, ear pain, fever, headaches, heartburn, malaise/fatigue, myalgias, nasal congestion, orthopnea, PND, postnasal drip, rhinorrhea, sneezing, sore throat, sweats, trouble swallowing or weight loss. She reports significant improvement on treatment. Her past medical history is significant for COPD.     Relevant past medical, surgical, family, and social history reviewed and updated as indicated.  Allergies and medications reviewed and updated.   Past Medical History:  Diagnosis Date  . Arthralgia of both knees 12/09/2016  . CHEST PAIN, ATYPICAL, HX OF 08/07/2008   Qualifier: Diagnosis of  By: Eden Emms, MD, Harrington Challenger   . Chronic radicular lumbar pain 03/02/2017  . Cigarette smoker 08/07/2008   Qualifier: Diagnosis of  By: Eden Emms, MD, Harrington Challenger   . COPD (chronic obstructive pulmonary disease) (HCC) 05/25/2017  . DOE (dyspnea on exertion) 08/07/2008     PFT's   07/19/08  wnl  X slt flattening on insp portion of fv loop  - 06/04/2017  Walked RA x 3 laps @ 185 ft each stopped due to  End of study, fast pace, no  desat  But had to slow down on lap 3 due to sob   - Allergy profile  06/04/17  >  Eos 0.4 /  IgE  374 RAST neg  - CTa 06/08/2017  Nl    . Extremity atherosclerosis with intermittent claudication (HCC) 03/02/2017  . HYPERTENSION, BENIGN 08/07/2008   Qualifier: Diagnosis of  By: Eden Emms, MD, Harrington Challenger   .  Morbid obesity due to excess calories (HCC) complicated by hbp/ doe 06/05/2017  . Myalgia 12/09/2016  . Pain in joints of both feet 12/09/2016  . Palpitations 05/25/2017  . PULMONARY NODULE 08/07/2008   Qualifier: Diagnosis of  By: Shelle Iron MD, Maree Krabbe     Past Surgical History:  Procedure Laterality Date  . ABDOMINAL HYSTERECTOMY    . BACK SURGERY     . CHOLECYSTECTOMY    . LEFT HEART CATH AND CORONARY ANGIOGRAPHY N/A 07/27/2017   Procedure: LEFT HEART CATH AND CORONARY ANGIOGRAPHY;  Surgeon: Marykay Lex, MD;  Location: [facility] INVASIVE CV LAB;  Service: Cardiovascular;  Laterality: N/A;    Social History   Socioeconomic History  . Marital status: Married    Spouse name: Not on file  . Number of children: Not on file  . Years of education: Not on file  . Highest education level: Not on file  Occupational History  . Not on file  Social Needs  . Financial resource strain: Not on file  . Food insecurity:    Worry: Not on file    Inability: Not on file  . Transportation needs:    Medical: Not on file    Non-medical: Not on file  Tobacco Use  . Smoking status: Current Every Day Smoker    Packs/day: 0.30    Years: 32.00    Pack years: 9.60    Types: Cigarettes  . Smokeless tobacco: Never Used  . Tobacco comment: Using patches.   Substance and Sexual Activity  . Alcohol use: No  . Drug use: No  . Sexual activity: Yes    Birth control/protection: Surgical  Lifestyle  . Physical activity:    Days per week: Not on file    Minutes per session: Not on file  . Stress: Not on file  Relationships  . Social connections:    Talks on phone: Not on file    Gets together: Not on file    Attends religious service: Not on file    Active member of club or organization: Not on file    Attends meetings of clubs or organizations: Not on file    Relationship status: Not on file  . Intimate partner violence:    Fear of current or ex partner: Not on file    Emotionally abused: Not on file    Physically abused: Not on file    Forced sexual activity: Not on file  Other Topics Concern  . Not on file  Social History Narrative  . Not on file    Outpatient Encounter Medications as of 09/14/2018  Medication Sig  . acetaminophen (TYLENOL) 500 MG tablet Take 1,000 mg by mouth every 6 (six) hours as needed for moderate pain or headache.  .  albuterol (PROVENTIL HFA;VENTOLIN HFA) 108 (90 Base) MCG/ACT inhaler Inhale 2 puffs into the lungs every 6 (six) hours as needed for wheezing or shortness of breath.  Marland Kitchen aspirin EC 81 MG tablet Take 81 mg by mouth daily.  . benzonatate (TESSALON PERLES) 100 MG capsule Take 1 capsule (100 mg total) by mouth 3 (three) times daily as needed for cough.  . budesonide-formoterol (SYMBICORT) 80-4.5 MCG/ACT inhaler Inhale 2 puffs into the lungs 2 (two) times daily.  . cilostazol (PLETAL) 100 MG tablet Take 1 tablet (100 mg total) by mouth 2 (two) times daily before a meal.  . gabapentin (NEURONTIN) 400 MG capsule TAKE 1 TO 3 CAPSULES BY MOUTH AT BEDTIME (  . hydrochlorothiazide (HYDRODIURIL) 25  MG tablet Take 25 mg by mouth daily.  Marland Kitchen. HYDROcodone-acetaminophen (NORCO) 10-325 MG tablet Take 1 tablet by mouth every 6 (six) hours as needed.  . metoprolol succinate (TOPROL-XL) 25 MG 24 hr tablet Take 1 tablet (25 mg total) by mouth daily.  . temazepam (RESTORIL) 15 MG capsule Take 1 capsule (15 mg total) by mouth at bedtime as needed for sleep.   No facility-administered encounter medications on file as of 09/14/2018.     No Known Allergies  Review of Systems  Constitutional: Negative for appetite change, fatigue, fever, malaise/fatigue and weight loss.  HENT: Negative for congestion, ear pain, hoarse voice, postnasal drip, rhinorrhea, sneezing, sore throat and trouble swallowing.   Respiratory: Positive for cough, sputum production and shortness of breath. Negative for hemoptysis and wheezing.   Cardiovascular: Positive for dyspnea on exertion (minimal). Negative for chest pain, palpitations, leg swelling and PND.  Gastrointestinal: Negative for heartburn.  Musculoskeletal: Negative for myalgias.  Neurological: Negative for dizziness, weakness, light-headedness and headaches.  Psychiatric/Behavioral: Negative for confusion.  All other systems reviewed and are negative.        Observations/Objective:  No vital signs or physical exam, this was a telephone or virtual health encounter.  Pt alert and oriented, answers all questions appropriately, and able to speak in full sentences.    Assessment and Plan: Stanton KidneyDebra was seen today for copd.  Diagnoses and all orders for this visit:  Chronic obstructive pulmonary disease, unspecified COPD type (HCC) Greatly improved after completion of antibiotics and steroids. Continue daily controller medications. Report any new or worsening symptoms.     Follow Up Instructions: Return if symptoms worsen or fail to improve.    I discussed the assessment and treatment plan with the patient. The patient was provided an opportunity to ask questions and all were answered. The patient agreed with the plan and demonstrated an understanding of the instructions.   The patient was advised to call back or seek an in-person evaluation if the symptoms worsen or if the condition fails to improve as anticipated.  The above assessment and management plan was discussed with the patient. The patient verbalized understanding of and has agreed to the management plan. Patient is aware to call the clinic if symptoms persist or worsen. Patient is aware when to return to the clinic for a follow-up visit. Patient educated on when it is appropriate to go to the emergency department.    I provided 15 minutes of non-face-to-face time during this encounter. The call started at 0850. The call ended at 0905. The other time was used for coordination of care.    Kari BaarsMichelle Ellaree Gear, FNP-C [facility] [address] [place], [place] [address] [phone]

## 2018-10-08 NOTE — Progress Notes (Signed)
Greater than 5 minutes, yet less than 10 minutes of time have been spent researching, coordinating, and implementing care for this patient today.  Thank you for the details you included in the comment boxes. Those details are very helpful in determining the best course of treatment for you and help us to provide the best care.  

## 2018-10-18 ENCOUNTER — Encounter: Payer: Self-pay | Admitting: Physician Assistant

## 2018-10-18 ENCOUNTER — Other Ambulatory Visit: Payer: Self-pay | Admitting: Physician Assistant

## 2018-12-25 ENCOUNTER — Other Ambulatory Visit: Payer: Self-pay | Admitting: Physician Assistant

## 2018-12-25 DIAGNOSIS — G8929 Other chronic pain: Secondary | ICD-10-CM

## 2019-01-04 ENCOUNTER — Ambulatory Visit (INDEPENDENT_AMBULATORY_CARE_PROVIDER_SITE_OTHER): Payer: 59 | Admitting: Nurse Practitioner

## 2019-01-04 ENCOUNTER — Encounter: Payer: Self-pay | Admitting: Nurse Practitioner

## 2019-01-04 ENCOUNTER — Other Ambulatory Visit: Payer: Self-pay

## 2019-01-04 VITALS — BP 152/86 | HR 98 | Temp 98.0°F | Resp 20 | Ht 61.0 in | Wt 203.0 lb

## 2019-01-04 DIAGNOSIS — L989 Disorder of the skin and subcutaneous tissue, unspecified: Secondary | ICD-10-CM

## 2019-01-04 DIAGNOSIS — L819 Disorder of pigmentation, unspecified: Secondary | ICD-10-CM | POA: Diagnosis not present

## 2019-01-04 NOTE — Progress Notes (Signed)
   Subjective:    Patient ID: Robin Keller, female    DOB: 07/18/1960, 58 y.o.   MRN: [id number]   Chief Complaint: redness in legs (Started 2 weeks ago. Swelling at times) and ? cysts on back   HPI Patient come sin today with 2 complaints: - inside of both legs above knees is purplish red and worsens throughout the day. Started 2 weeks ago. denies any pain. - draining cyst on back. Started about  A week ago.  Review of Systems  Constitutional: Negative for activity change and appetite change.  HENT: Negative.   Eyes: Negative for pain.  Respiratory: Negative for shortness of breath.   Cardiovascular: Negative for chest pain, palpitations and leg swelling.  Gastrointestinal: Negative for abdominal pain.  Endocrine: Negative for polydipsia.  Genitourinary: Negative.   Skin: Negative for rash.  Neurological: Negative for dizziness, weakness and headaches.  Hematological: Does not bruise/bleed easily.  Psychiatric/Behavioral: Negative.   All other systems reviewed and are negative.      Objective:   Physical Exam Vitals signs and nursing note reviewed.  Constitutional:      Appearance: Normal appearance.  Cardiovascular:     Rate and Rhythm: Normal rate and regular rhythm.     Pulses:          Posterior tibial pulses are 3+ on the right side and 3+ on the left side.     Heart sounds: Normal heart sounds.  Pulmonary:     Breath sounds: Normal breath sounds.  Skin:    Comments: Sherle Poe appear area bil inner thighs- cool to touch with blanching 6cm raised lesion on right mid back- yellowish drainage  Neurological:     Mental Status: She is alert.       BP (!) 152/86   Pulse 98   Temp 98 F (36.7 C) (Oral)   Resp 20   Ht 5\' 1"  (1.549 m)   Wt 203 lb (92.1 kg)   SpO2 98%   BMI 38.36 kg/m        Assessment & Plan:  Robin Keller in today with chief complaint of redness in legs (Started 2 weeks ago. Swelling at times) and ? cysts on back   1.  Discoloration of skin Due to circulatory changes Take daily baby aspirin If no change may need to repeat lower ext vascular studies  2. Back skin lesion Keep covered Do not pick or scratch at area - Ambulatory referral to Dermatology  Follow up prn  Mary-Margaret Hassell Done, FNP

## 2019-01-10 ENCOUNTER — Other Ambulatory Visit: Payer: Self-pay | Admitting: Physician Assistant

## 2019-01-10 DIAGNOSIS — F5101 Primary insomnia: Secondary | ICD-10-CM

## 2019-01-11 ENCOUNTER — Other Ambulatory Visit: Payer: Self-pay | Admitting: Physician Assistant

## 2019-01-11 DIAGNOSIS — F5101 Primary insomnia: Secondary | ICD-10-CM

## 2019-01-12 ENCOUNTER — Other Ambulatory Visit: Payer: Self-pay | Admitting: Physician Assistant

## 2019-01-12 DIAGNOSIS — F5101 Primary insomnia: Secondary | ICD-10-CM

## 2019-01-24 ENCOUNTER — Other Ambulatory Visit: Payer: Self-pay | Admitting: Physician Assistant

## 2019-01-24 DIAGNOSIS — G8929 Other chronic pain: Secondary | ICD-10-CM

## 2019-01-24 NOTE — Telephone Encounter (Signed)
Jones. NTBS 30 days given 12/28/18

## 2019-01-24 NOTE — Telephone Encounter (Signed)
Lmtcb/ww  

## 2019-01-26 ENCOUNTER — Encounter: Payer: Self-pay | Admitting: Physician Assistant

## 2019-01-26 ENCOUNTER — Other Ambulatory Visit: Payer: Self-pay | Admitting: Physician Assistant

## 2019-01-26 ENCOUNTER — Ambulatory Visit (INDEPENDENT_AMBULATORY_CARE_PROVIDER_SITE_OTHER): Payer: 59 | Admitting: Physician Assistant

## 2019-01-26 DIAGNOSIS — M25552 Pain in left hip: Secondary | ICD-10-CM | POA: Diagnosis not present

## 2019-01-26 DIAGNOSIS — F5101 Primary insomnia: Secondary | ICD-10-CM | POA: Diagnosis not present

## 2019-01-26 DIAGNOSIS — G8929 Other chronic pain: Secondary | ICD-10-CM

## 2019-01-26 MED ORDER — TEMAZEPAM 15 MG PO CAPS: ORAL_CAPSULE | ORAL | 5 refills | Status: DC

## 2019-01-26 MED ORDER — GABAPENTIN 600 MG PO TABS: 600.0000 mg | ORAL_TABLET | Freq: Two times a day (BID) | ORAL | 3 refills | Status: DC

## 2019-01-26 NOTE — Progress Notes (Signed)
Telephone visit  Subjective: CC: Hip pain, insomnia PCP: Terald Sleeper, PA-C CNO:[id number] [name] is a 58 y.o. female calls for telephone consult today. Patient provides verbal consent for consult held via phone.  Patient is identified with 2 separate identifiers.  At this time the entire area is on COVID-19 social distancing and stay home orders are in place.  Patient is of higher risk and therefore we are performing this by a virtual method.  Location of patient: Home Location of provider: HOME Others present for call: No  Patient is here.  Recheck on her chronic medical conditions which do include chronic hip pain and insomnia.  She states that the gabapentin has been helping with her hip pain.  If she misses a dose she does notice that it is more painful.  She is taking 3 a day of the 400 mg.  I have offered to switch it over to a 600 mg where she can take 2 a day and that we lessen her pill burden.  And she elects to do that.  Also she needs a refill on her Restoril.  She needs it every night at bed.  If she does not take it she will not sleep.  Even if she does take it she only sleeps a few hours anyway.   ROS: Per HPI  No Known Allergies Past Medical History:  Diagnosis Date  . Arthralgia of both knees 12/09/2016  . CHEST PAIN, ATYPICAL, HX OF 08/07/2008   Qualifier: Diagnosis of  By: Johnsie Cancel, MD, Rona Ravens   . Chronic radicular lumbar pain 03/02/2017  . Cigarette smoker 08/07/2008   Qualifier: Diagnosis of  By: Johnsie Cancel, MD, Rona Ravens   . COPD (chronic obstructive pulmonary disease) ([place]) 05/25/2017  . DOE (dyspnea on exertion) 08/07/2008     PFT's   07/19/08  wnl  X slt flattening on insp portion of fv loop  - 06/04/2017  Walked RA x 3 laps @ 185 ft each stopped due to  End of study, fast pace, no  desat  But had to slow down on lap 3 due to sob   - Allergy profile  06/04/17  >  Eos 0.4 /  IgE  374 RAST neg  - CTa 06/08/2017  Nl    . Extremity atherosclerosis with  intermittent claudication ([place]) 03/02/2017  . HYPERTENSION, BENIGN 08/07/2008   Qualifier: Diagnosis of  By: Johnsie Cancel, MD, Rona Ravens   . Morbid obesity due to excess calories ([place]) complicated by hbp/ doe 8/36/6294  . Myalgia 12/09/2016  . Pain in joints of both feet 12/09/2016  . Palpitations 05/25/2017  . PULMONARY NODULE 08/07/2008   Qualifier: Diagnosis of  By: Gwenette Greet MD, Armando Reichert     Current Outpatient Medications:  .  acetaminophen (TYLENOL) 500 MG tablet, Take 1,000 mg by mouth every 6 (six) hours as needed for moderate pain or headache., Disp: , Rfl:  .  albuterol (PROVENTIL HFA;VENTOLIN HFA) 108 (90 Base) MCG/ACT inhaler, Inhale 2 puffs into the lungs every 6 (six) hours as needed for wheezing or shortness of breath., Disp: 1 Inhaler, Rfl: 0 .  budesonide-formoterol (SYMBICORT) 80-4.5 MCG/ACT inhaler, Inhale 2 puffs into the lungs 2 (two) times daily., Disp: 1 Inhaler, Rfl: 1 .  gabapentin (NEURONTIN) 600 MG tablet, Take 1 tablet (600 mg total) by mouth 2 (two) times daily., Disp: 180 tablet, Rfl: 3 .  temazepam (RESTORIL) 15 MG capsule, TAKE 1 CAPSULE BY MOUTH AT BEDTIME AS NEEDED  FOR SLEEP, Disp: 30 capsule, Rfl: 5  Assessment/ Plan: 59 y.o. female   1. Chronic hip pain, left Continue gabapentin 600 mg twice daily  2. Primary insomnia - temazepam (RESTORIL) 15 MG capsule; TAKE 1 CAPSULE BY MOUTH AT BEDTIME AS NEEDED FOR SLEEP  Dispense: 30 capsule; Refill: 5   No follow-ups on file.  Continue all other maintenance medications as listed above.  Start time: 2:13 PM End time: 2:19 PM  Meds ordered this encounter  Medications  . gabapentin (NEURONTIN) 600 MG tablet    Sig: Take 1 tablet (600 mg total) by mouth 2 (two) times daily.    Dispense:  180 tablet    Refill:  3    Order Specific Question:   Supervising Provider    Answer:   Raliegh Ip [[id number]]  . temazepam (RESTORIL) 15 MG capsule    Sig: TAKE 1 CAPSULE BY MOUTH AT BEDTIME AS NEEDED FOR SLEEP     Dispense:  30 capsule    Refill:  5    Order Specific Question:   Supervising Provider    Answer:   Raliegh Ip [[id number]]    Prudy Feeler PA-C [facility] [phone]

## 2019-02-23 ENCOUNTER — Telehealth: Payer: 59 | Admitting: Family

## 2019-02-23 DIAGNOSIS — R059 Cough, unspecified: Secondary | ICD-10-CM

## 2019-02-23 DIAGNOSIS — R05 Cough: Secondary | ICD-10-CM

## 2019-02-23 DIAGNOSIS — J440 Chronic obstructive pulmonary disease with acute lower respiratory infection: Secondary | ICD-10-CM | POA: Diagnosis not present

## 2019-02-23 DIAGNOSIS — J209 Acute bronchitis, unspecified: Secondary | ICD-10-CM

## 2019-02-23 MED ORDER — PREDNISONE 10 MG (21) PO TBPK: ORAL_TABLET | ORAL | 0 refills | Status: DC

## 2019-02-23 NOTE — Progress Notes (Signed)
We are sorry that you are not feeling well.  Here is how we plan to help!  Based on your presentation I believe you most likely have A cough due to a virus.  This is called viral bronchitis and is best treated by rest, plenty of fluids and control of the cough.  You may use Ibuprofen or Tylenol as directed to help your symptoms.    Given your symptoms you should be tested for COVID. You can go to one of the testing sites listed below, while they are opened (see hours). You do not need an order and will stay in your car during the test. You do need to self isolate until your results return and if positive 10 days from when your symptoms started and until you are 3 days fever free.   Testing Locations (Monday - Friday, 8 a.m. - 3:30 p.m.) . [place]: [facility] at [facility], [address] [place], [place]: [place], [place], [place], [place] (entrance off [address])  . [facility]: (Closed each Monday): Testing site relocated to the short stay covered drive at [facility]. (Use the [facility] entrance to [facility] next to [facility].)    In addition you may use A non-prescription cough medication called Robitussin DAC. Take 2 teaspoons every 8 hours or Delsym: take 2 teaspoons every 12 hours. and A non-prescription cough medication called Mucinex DM: take 2 tablets every 12 hours.  Prednisone 10 mg daily for 6 days (see taper instructions below)  Directions for 6 day taper: Day 1: 2 tablets before breakfast, 1 after both lunch & dinner and 2 at bedtime Day 2: 1 tab before breakfast, 1 after both lunch & dinner and 2 at bedtime Day 3: 1 tab at each meal & 1 at bedtime Day 4: 1 tab at breakfast, 1 at lunch, 1 at bedtime Day 5: 1 tab at breakfast & 1 tab at bedtime Day 6: 1 tab at breakfast   From your responses in the eVisit questionnaire you describe inflammation in the upper  respiratory tract which is causing a significant cough.  This is commonly called Bronchitis and has four common causes:    Allergies  Viral Infections  Acid Reflux  Bacterial Infection Allergies, viruses and acid reflux are treated by controlling symptoms or eliminating the cause. An example might be a cough caused by taking certain blood pressure medications. You stop the cough by changing the medication. Another example might be a cough caused by acid reflux. Controlling the reflux helps control the cough.  USE OF BRONCHODILATOR ("RESCUE") INHALERS: There is a risk from using your bronchodilator too frequently.  The risk is that over-reliance on a medication which only relaxes the muscles surrounding the breathing tubes can reduce the effectiveness of medications prescribed to reduce swelling and congestion of the tubes themselves.  Although you feel brief relief from the bronchodilator inhaler, your asthma may actually be worsening with the tubes becoming more swollen and filled with mucus.  This can delay other crucial treatments, such as oral steroid medications. If you need to use a bronchodilator inhaler daily, several times per day, you should discuss this with your provider.  There are probably better treatments that could be used to keep your asthma under control.     HOME CARE . Only take medications as instructed by your medical team. . Complete the entire course of an antibiotic. . Drink  plenty of fluids and get plenty of rest. . Avoid close contacts especially the very young and the elderly . Cover your mouth if you cough or cough into your sleeve. . Always remember to wash your hands . A steam or ultrasonic humidifier can help congestion.   GET HELP RIGHT AWAY IF: . You develop worsening fever. . You become short of breath . You cough up blood. . Your symptoms persist after you have completed your treatment plan MAKE SURE YOU   Understand these instructions.  Will watch  your condition.  Will get help right away if you are not doing well or get worse.  Your e-visit answers were reviewed by a board certified advanced clinical practitioner to complete your personal care plan.  Depending on the condition, your plan could have included both over the counter or prescription medications. If there is a problem please reply  once you have received a response from your provider. Your safety is important to [place].  If you have drug allergies check your prescription carefully.    You can use MyChart to ask questions about today's visit, request a non-urgent call back, or ask for a work or school excuse for 24 hours related to this e-Visit. If it has been greater than 24 hours you will need to follow up with your provider, or enter a new e-Visit to address those concerns. You will get an e-mail in the next two days asking about your experience.  I hope that your e-visit has been valuable and will speed your recovery. Thank you for using e-visits.  Approximately 5 minutes was spent documenting and reviewing patient's chart.

## 2019-04-12 IMAGING — DX DG SHOULDER 2+V*R*
3 series · 3 of 3 positions shown · non-contrast
Comparison: None

CLINICAL DATA: RIGHT shoulder pain, no known injury

EXAM:
RIGHT SHOULDER - 2+ VIEW

[shoulder ap]
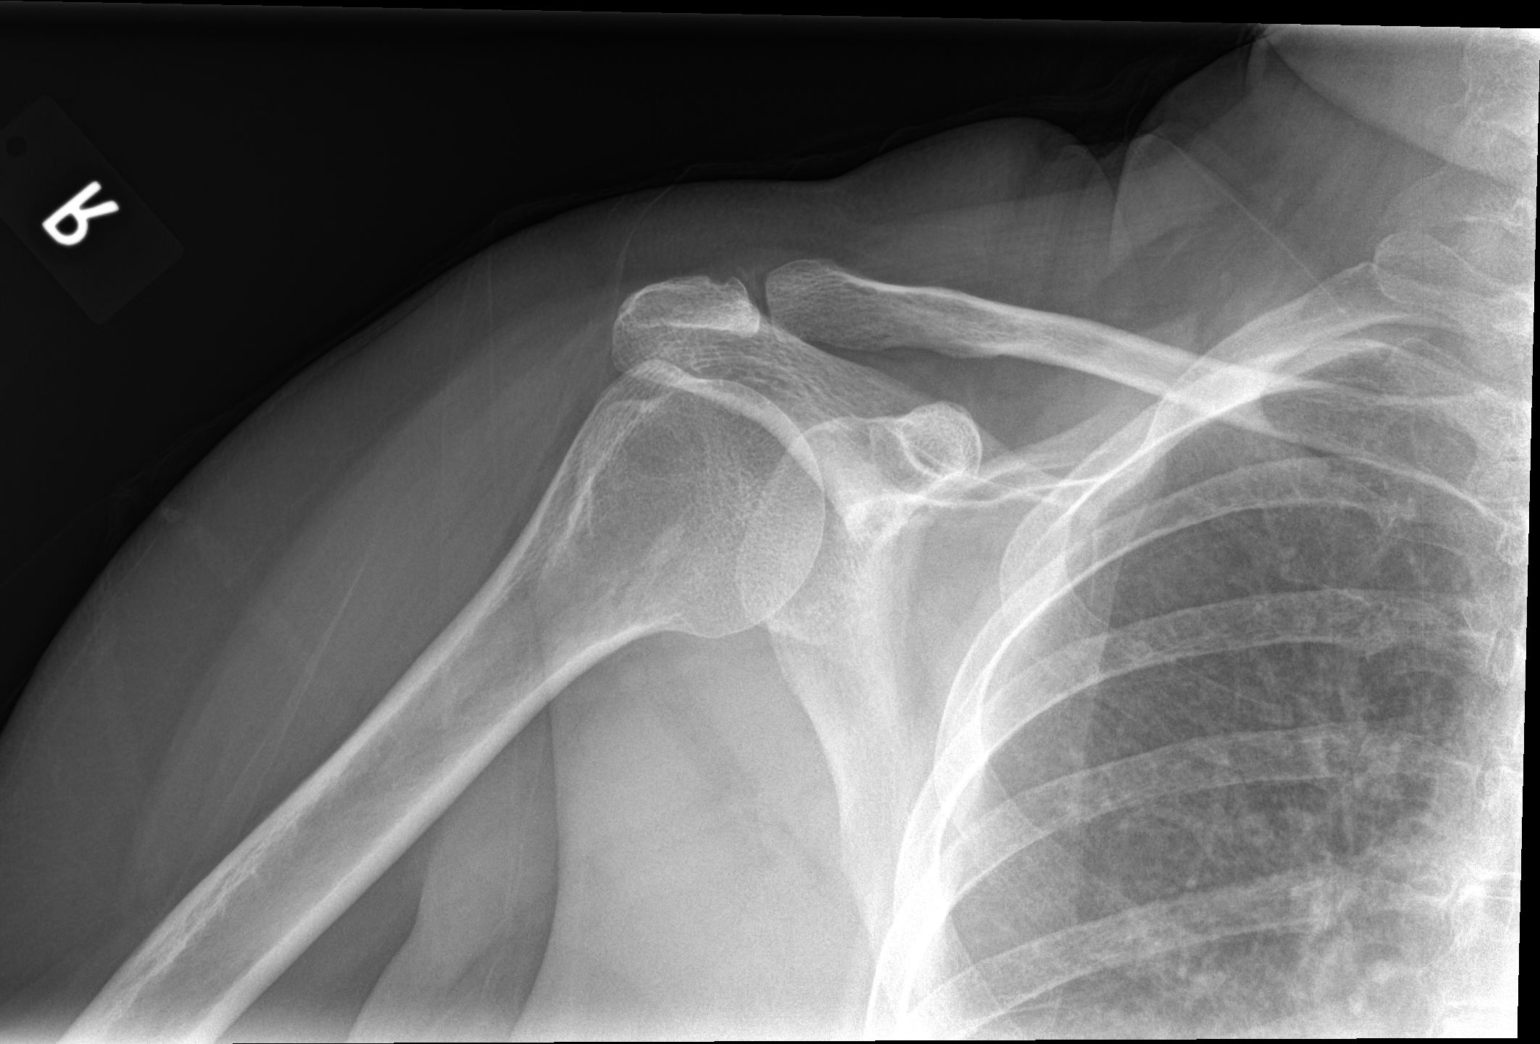

[shoulder obl]
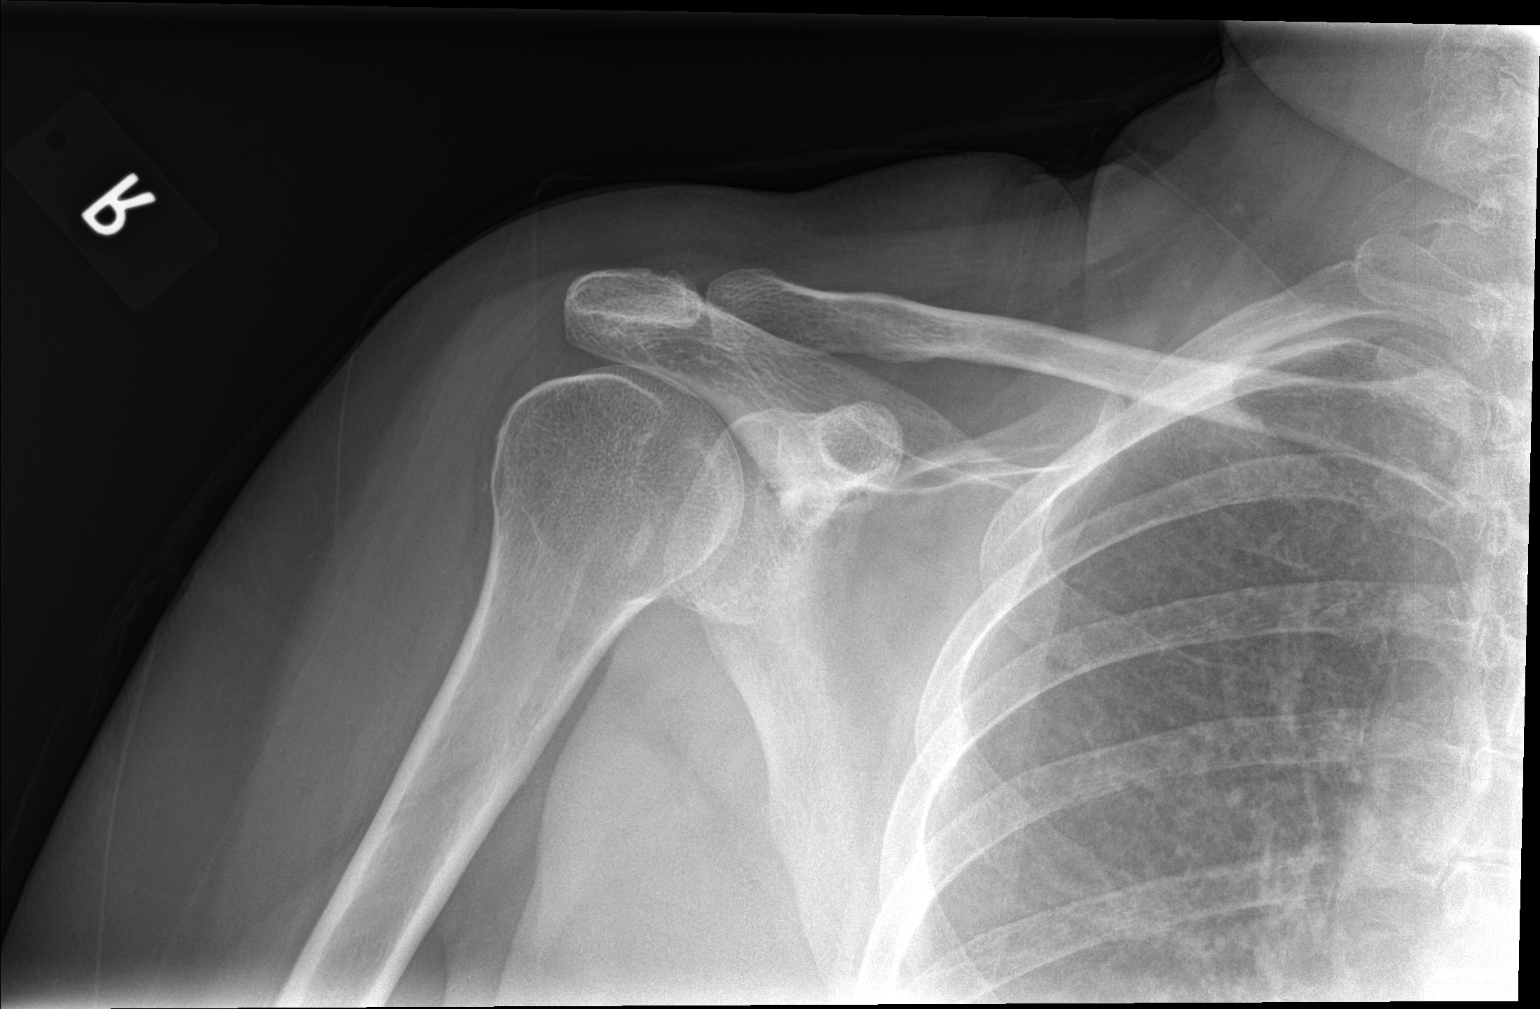

[shoulder axial]
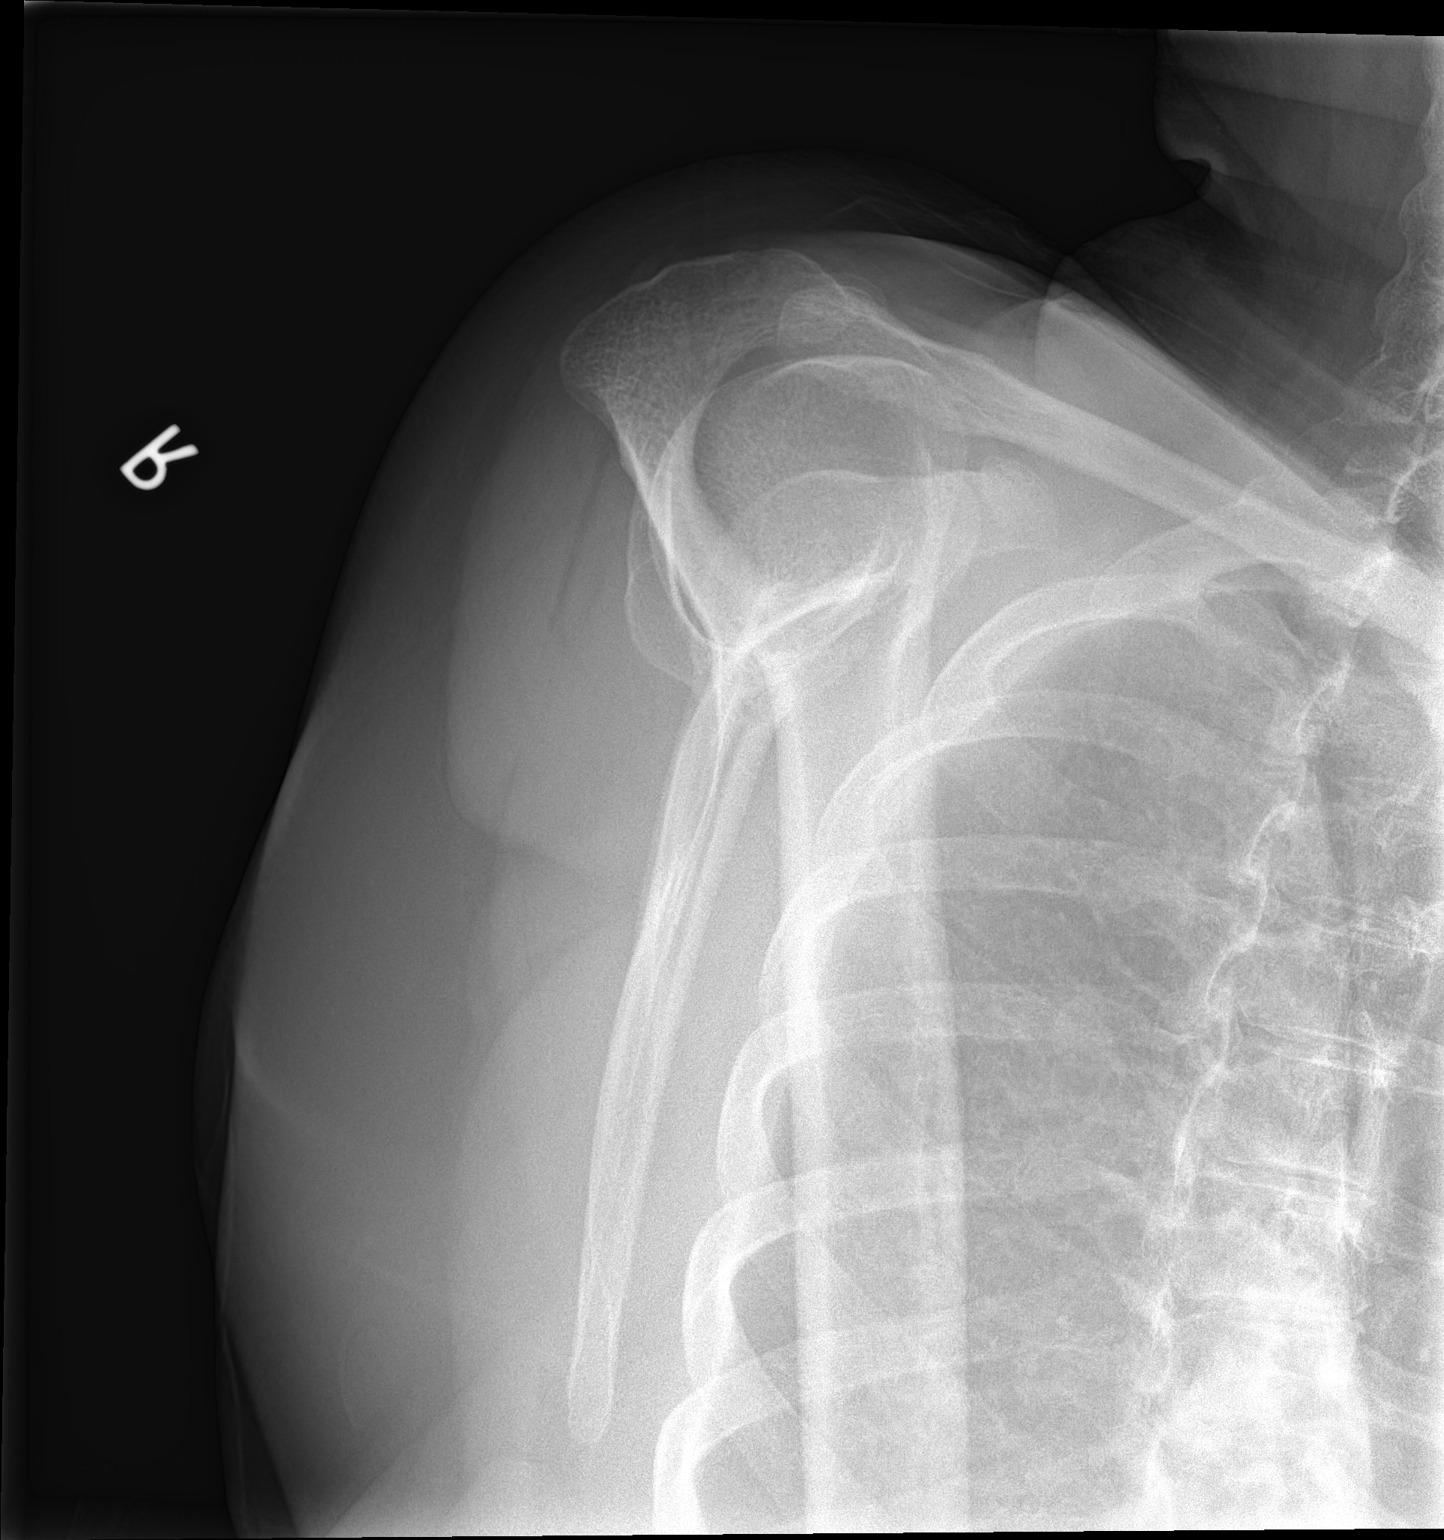

[3 of 3 positions shown; findings below may reference images not displayed]

FINDINGS: Osseous demineralization.

AC joint alignment normal.

No acute fracture, dislocation, or bone destruction.

Visualized RIGHT ribs intact.
IMPRESSION: No acute abnormalities.

## 2019-04-12 IMAGING — DX DG KNEE STANDING AP BILAT
3 series · 3 of 3 positions shown · non-contrast
Comparison: None

CLINICAL DATA: BILATERAL knee pain, no known injury

EXAM:
BILATERAL KNEES STANDING - 1 VIEW

[knee ap]
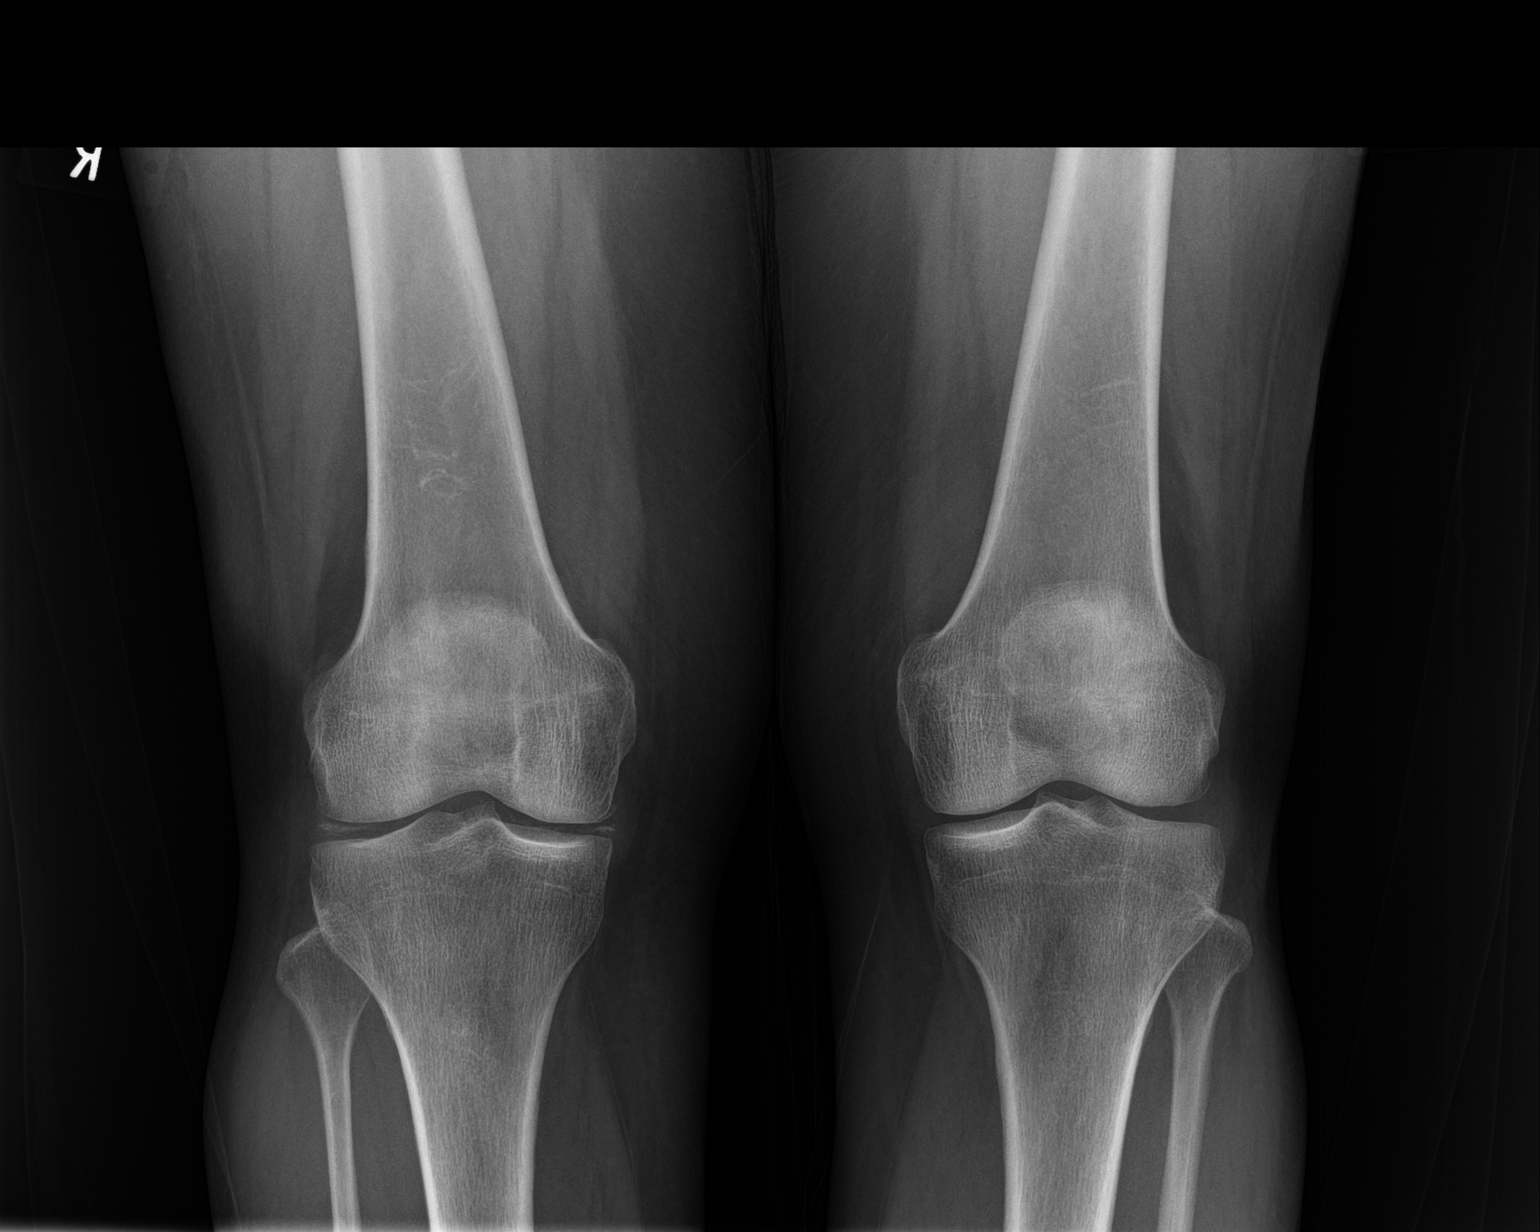

[knee lat (1 of 2)]
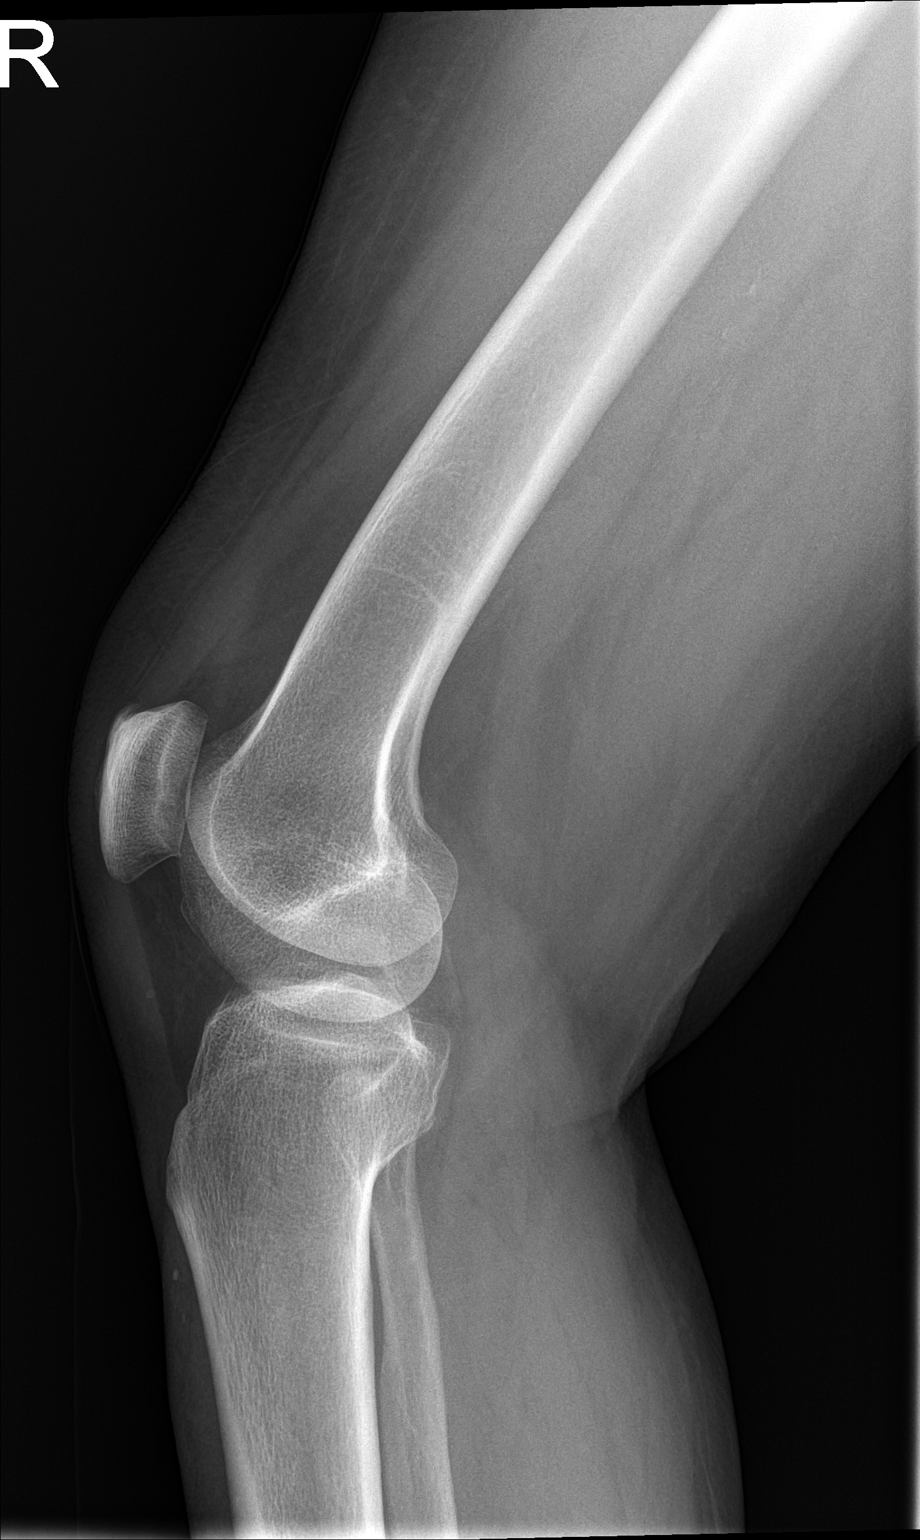

[knee lat (2 of 2)]
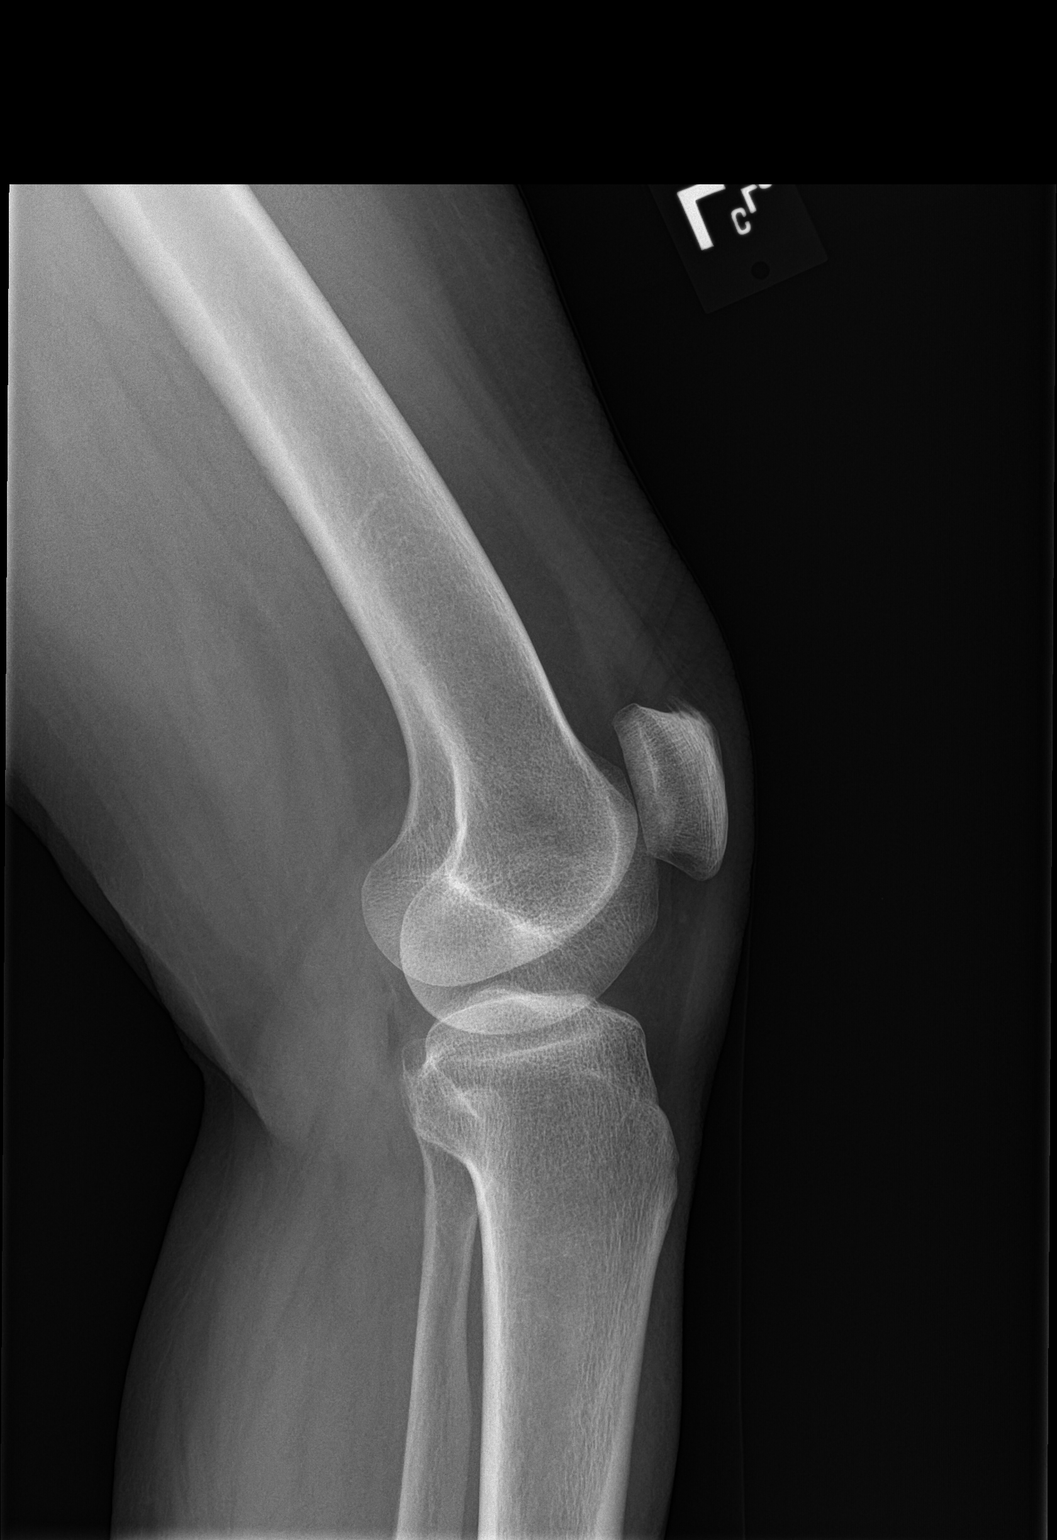

[3 of 3 positions shown; findings below may reference images not displayed]

FINDINGS: RIGHT knee:

Osseous demineralization.

Minimal joint space narrowing with chondrocalcinosis question CPPD.

No fracture, dislocation, or bone destruction.

No knee joint effusion.

LEFT knee:

Osseous demineralization.

Joint spaces preserved.

No acute fracture, dislocation, bone destruction or joint effusion.
IMPRESSION: Osseous demineralization with question CPPD RIGHT knee.

No acute abnormalities identified in either knee.

## 2019-05-25 ENCOUNTER — Telehealth: Payer: 59 | Admitting: Nurse Practitioner

## 2019-05-25 DIAGNOSIS — J029 Acute pharyngitis, unspecified: Secondary | ICD-10-CM | POA: Diagnosis not present

## 2019-05-25 NOTE — Progress Notes (Signed)
We are sorry that you are not feeling well.  Here is how we plan to help!  Your symptoms indicate a likely viral infection (Pharyngitis).   Pharyngitis is inflammation in the back of the throat which can cause a sore throat, scratchiness and sometimes difficulty swallowing.   Pharyngitis is typically caused by a respiratory virus and will just run its course.  Please keep in mind that your symptoms could last up to 10 days.  For throat pain, we recommend over the counter oral pain relief medications such as acetaminophen or aspirin, or anti-inflammatory medications such as ibuprofen or naproxen sodium.  Topical treatments such as oral throat lozenges or sprays may be used as needed.  Avoid close contact with loved ones, especially the very young and elderly.  Remember to wash your hands thoroughly throughout the day as this is the number one way to prevent the spread of infection and wipe down door knobs and counters with disinfectant.  After careful review of your answers, I would not recommend and antibiotic for your condition.  Antibiotics should not be used to treat conditions that we suspect are caused by viruses like the virus that causes the common cold or flu. However, some people can have Strep with atypical symptoms. You may need formal testing in clinic or office to confirm if your symptoms continue or worsen.  Providers prescribe antibiotics to treat infections caused by bacteria. Antibiotics are very powerful in treating bacterial infections when they are used properly.  To maintain their effectiveness, they should be used only when necessary.  Overuse of antibiotics has resulted in the development of super bugs that are resistant to treatment!    If continues may want to have covid test Home Care:  Only take medications as instructed by your medical team.  Do not drink alcohol while taking these medications.  A steam or ultrasonic humidifier can help congestion.  You can place a towel  over your head and breathe in the steam from hot water coming from a faucet.  Avoid close contacts especially the very young and the elderly.  Cover your mouth when you cough or sneeze.  Always remember to wash your hands.  Get Help Right Away If:  You develop worsening fever or throat pain.  You develop a severe head ache or visual changes.  Your symptoms persist after you have completed your treatment plan.  Make sure you  Understand these instructions.  Will watch your condition.  Will get help right away if you are not doing well or get worse.  Your e-visit answers were reviewed by a board certified advanced clinical practitioner to complete your personal care plan.  Depending on the condition, your plan could have included both over the counter or prescription medications.  If there is a problem please reply  once you have received a response from your provider.  Your safety is important to [place].  If you have drug allergies check your prescription carefully.    You can use MyChart to ask questions about todays visit, request a non-urgent call back, or ask for a work or school excuse for 24 hours related to this e-Visit. If it has been greater than 24 hours you will need to follow up with your provider, or enter a new e-Visit to address those concerns.  You will get an e-mail in the next two days asking about your experience.  I hope that your e-visit has been valuable and will speed your recovery. Thank you for  using e-visits.   5-10 minutes spent reviewing and documenting in chart.

## 2019-05-30 ENCOUNTER — Encounter: Payer: Self-pay | Admitting: Physician Assistant

## 2019-05-30 ENCOUNTER — Other Ambulatory Visit: Payer: Self-pay | Admitting: Physician Assistant

## 2019-05-30 MED ORDER — AMLODIPINE BESYLATE 5 MG PO TABS: 5.0000 mg | ORAL_TABLET | Freq: Every day | ORAL | 1 refills | Status: DC

## 2019-08-18 ENCOUNTER — Other Ambulatory Visit: Payer: Self-pay | Admitting: *Deleted

## 2019-08-18 DIAGNOSIS — F5101 Primary insomnia: Secondary | ICD-10-CM

## 2019-08-22 ENCOUNTER — Encounter: Payer: Self-pay | Admitting: Nurse Practitioner

## 2019-08-22 ENCOUNTER — Other Ambulatory Visit: Payer: Self-pay

## 2019-08-22 ENCOUNTER — Encounter: Payer: Self-pay | Admitting: Family Medicine

## 2019-08-22 ENCOUNTER — Ambulatory Visit: Payer: 59 | Admitting: Family Medicine

## 2019-08-22 VITALS — BP 140/85 | HR 94 | Temp 97.6°F | Ht 61.0 in | Wt 213.0 lb

## 2019-08-22 DIAGNOSIS — Z13220 Encounter for screening for lipoid disorders: Secondary | ICD-10-CM

## 2019-08-22 DIAGNOSIS — I10 Essential (primary) hypertension: Secondary | ICD-10-CM

## 2019-08-22 DIAGNOSIS — F5101 Primary insomnia: Secondary | ICD-10-CM | POA: Diagnosis not present

## 2019-08-22 DIAGNOSIS — J449 Chronic obstructive pulmonary disease, unspecified: Secondary | ICD-10-CM | POA: Diagnosis not present

## 2019-08-22 DIAGNOSIS — G629 Polyneuropathy, unspecified: Secondary | ICD-10-CM | POA: Diagnosis not present

## 2019-08-22 MED ORDER — ALBUTEROL SULFATE HFA 108 (90 BASE) MCG/ACT IN AERS: 2.0000 | INHALATION_SPRAY | Freq: Four times a day (QID) | RESPIRATORY_TRACT | 3 refills | Status: DC | PRN

## 2019-08-22 MED ORDER — GABAPENTIN 600 MG PO TABS: 600.0000 mg | ORAL_TABLET | Freq: Two times a day (BID) | ORAL | 0 refills | Status: DC

## 2019-08-22 MED ORDER — SPIRIVA HANDIHALER 18 MCG IN CAPS: 18.0000 ug | ORAL_CAPSULE | Freq: Every day | RESPIRATORY_TRACT | 12 refills | Status: AC

## 2019-08-22 MED ORDER — DOXEPIN HCL 10 MG PO CAPS: 10.0000 mg | ORAL_CAPSULE | Freq: Every day | ORAL | 1 refills | Status: DC

## 2019-08-22 NOTE — Progress Notes (Signed)
BP 140/85   Pulse 94   Temp 97.6 F (36.4 C)   Ht 5' 1"  (1.549 m)   Wt 213 lb (96.6 kg)   SpO2 96%   BMI 40.25 kg/m    Subjective:   Patient ID: Robin Keller, female    DOB: June 21, 1960, 59 y.o.   MRN: [id number]  HPI: Robin Keller is a 59 y.o. female presenting on 08/22/2019 for Discuss Meds and Insomnia   HPI Patient is coming in today with complaints of difficulty sleeping, sometimes she has difficulty falling asleep because of what she has been told is neuropathy but based on her symptoms she says it is more of a cramping and feels like its pain and movement down her legs that comes only when she tries to go to sleep at night and may be restless leg.  She has seen a neurologist per her in the past.  She said the neurologist did not find anything wrong with her blood flow or anything.  She denies any back or spinal problems.  We will do some blood work and will follow up with this with PCP future PCP.  Hypertension Patient is currently on no medication currently, and their blood pressure today is 140/85. Patient denies any lightheadedness or dizziness. Patient denies headaches, blurred vision, chest pains, shortness of breath, or weakness. Denies any side effects from medication and is content with current medication.   COPD Patient is coming in for COPD recheck today.  He is currently on Spiriva and albuterol but only uses Spiriva as needed.  He has a mild chronic cough but denies any major coughing spells or wheezing spells.  He has 0nighttime symptoms per week and 0daytime symptoms per week currently.  Patient is trying to quit smoking and smoked 1-1/2 packs/day before and is down to 5 cigarettes/day now  Relevant past medical, surgical, family and social history reviewed and updated as indicated. Interim medical history since our last visit reviewed. Allergies and medications reviewed and updated.  Review of Systems  Constitutional: Negative for chills and fever.  HENT:  Negative for congestion, ear discharge and ear pain.   Eyes: Negative for redness and visual disturbance.  Respiratory: Positive for cough. Negative for chest tightness and shortness of breath.   Cardiovascular: Negative for chest pain and leg swelling.  Genitourinary: Negative for difficulty urinating and dysuria.  Musculoskeletal: Negative for back pain and gait problem.  Skin: Negative for rash.  Neurological: Negative for light-headedness, numbness and headaches.  Psychiatric/Behavioral: Positive for sleep disturbance (At nighttime leg cramping and pain and sometimes difficulty staying asleep). Negative for agitation, behavioral problems and dysphoric mood. The patient is not nervous/anxious.   All other systems reviewed and are negative.   Per HPI unless specifically indicated above   Allergies as of 08/22/2019   No Known Allergies     Medication List       Accurate as of Aug 22, 2019 11:54 AM. If you have any questions, ask your nurse or doctor.        STOP taking these medications   amLODipine 5 MG tablet Commonly known as: NORVASC Stopped by: Worthy Rancher, MD   budesonide-formoterol 80-4.5 MCG/ACT inhaler Commonly known as: SYMBICORT Stopped by: Fransisca Kaufmann Aul Mangieri, MD   predniSONE 10 MG (21) Tbpk tablet Commonly known as: STERAPRED UNI-PAK 21 TAB Stopped by: Fransisca Kaufmann Tyniya Kuyper, MD   temazepam 15 MG capsule Commonly known as: RESTORIL Stopped by: Worthy Rancher, MD  TAKE these medications   acetaminophen 500 MG tablet Commonly known as: TYLENOL Take 1,000 mg by mouth every 6 (six) hours as needed for moderate pain or headache.   albuterol 108 (90 Base) MCG/ACT inhaler Commonly known as: VENTOLIN HFA Inhale 2 puffs into the lungs every 6 (six) hours as needed for wheezing or shortness of breath.   doxepin 10 MG capsule Commonly known as: SINEQUAN Take 1 capsule (10 mg total) by mouth at bedtime. Started by: Worthy Rancher, MD   gabapentin  600 MG tablet Commonly known as: NEURONTIN Take 1 tablet (600 mg total) by mouth 2 (two) times daily.   Spiriva HandiHaler 18 MCG inhalation capsule Generic drug: tiotropium Place 1 capsule (18 mcg total) into inhaler and inhale daily. Started by: Fransisca Kaufmann Laquasha Groome, MD        Objective:   BP 140/85   Pulse 94   Temp 97.6 F (36.4 C)   Ht 5' 1"  (1.549 m)   Wt 213 lb (96.6 kg)   SpO2 96%   BMI 40.25 kg/m   Wt Readings from Last 3 Encounters:  08/22/19 213 lb (96.6 kg)  01/04/19 203 lb (92.1 kg)  07/13/18 203 lb (92.1 kg)    Physical Exam Vitals and nursing note reviewed.  Constitutional:      General: She is not in acute distress.    Appearance: She is well-developed. She is not diaphoretic.  Eyes:     Conjunctiva/sclera: Conjunctivae normal.  Cardiovascular:     Rate and Rhythm: Normal rate and regular rhythm.     Heart sounds: Normal heart sounds. No murmur.  Pulmonary:     Effort: Pulmonary effort is normal. No respiratory distress.     Breath sounds: Normal breath sounds. No wheezing.  Musculoskeletal:        General: No tenderness. Normal range of motion.  Skin:    General: Skin is warm and dry.     Findings: No rash.  Neurological:     Mental Status: She is alert and oriented to person, place, and time.     Coordination: Coordination normal.  Psychiatric:        Behavior: Behavior normal.       Assessment & Plan:   Problem List Items Addressed This Visit      Cardiovascular and Mediastinum   HYPERTENSION, BENIGN - Primary   Relevant Orders   CBC with Differential/Platelet   CMP14+EGFR     Respiratory   COPD (chronic obstructive pulmonary disease) (HCC)   Relevant Medications   tiotropium (SPIRIVA HANDIHALER) 18 MCG inhalation capsule   albuterol (VENTOLIN HFA) 108 (90 Base) MCG/ACT inhaler   Other Relevant Orders   CBC with Differential/Platelet     Other   Primary insomnia   Relevant Medications   doxepin (SINEQUAN) 10 MG capsule    gabapentin (NEURONTIN) 600 MG tablet   Other Relevant Orders   TSH    Other Visit Diagnoses    Neuropathy       Relevant Medications   gabapentin (NEURONTIN) 600 MG tablet   Other Relevant Orders   CBC with Differential/Platelet   CMP14+EGFR   Lipid panel   TSH   Vitamin B12   Lipid screening       Relevant Orders   Lipid panel      Have patient follow-up with PCP but will try doxepin instead of temazepam and then she will try to lower herself off of the gabapentin if the doxepin is working, may need  to go up on the doxepin in the future. Follow up plan: Return in about 4 weeks (around 09/19/2019), or if symptoms worsen or fail to improve, for Follow-up insomnia and neuropathy with PCP.  Counseling provided for all of the vaccine components Orders Placed This Encounter  Procedures  . CBC with Differential/Platelet  . CMP14+EGFR  . Lipid panel  . TSH  . Vitamin B12    Caryl Pina, MD [facility] 08/22/2019, 11:54 AM

## 2019-08-23 LAB — CBC WITH DIFFERENTIAL/PLATELET
Basophils Absolute: 0.1 10*3/uL (ref 0.0–0.2)
Basos: 1 %
EOS (ABSOLUTE): 0.3 10*3/uL (ref 0.0–0.4)
Eos: 2 %
Hematocrit: 40.1 % (ref 34.0–46.6)
Hemoglobin: 13.7 g/dL (ref 11.1–15.9)
Immature Grans (Abs): 0 10*3/uL (ref 0.0–0.1)
Immature Granulocytes: 0 %
Lymphocytes Absolute: 3.9 10*3/uL — ABNORMAL HIGH (ref 0.7–3.1)
Lymphs: 30 %
MCH: 29.9 pg (ref 26.6–33.0)
MCHC: 34.2 g/dL (ref 31.5–35.7)
MCV: 88 fL (ref 79–97)
Monocytes Absolute: 0.5 10*3/uL (ref 0.1–0.9)
Monocytes: 4 %
Neutrophils Absolute: 8.1 10*3/uL — ABNORMAL HIGH (ref 1.4–7.0)
Neutrophils: 63 %
Platelets: 326 10*3/uL (ref 150–450)
RBC: 4.58 x10E6/uL (ref 3.77–5.28)
RDW: 13.3 % (ref 11.7–15.4)
WBC: 12.9 10*3/uL — ABNORMAL HIGH (ref 3.4–10.8)

## 2019-08-23 LAB — LIPID PANEL
Chol/HDL Ratio: 5.9 ratio — ABNORMAL HIGH (ref 0.0–4.4)
Cholesterol, Total: 226 mg/dL — ABNORMAL HIGH (ref 100–199)
HDL: 38 mg/dL — ABNORMAL LOW (ref >39)
LDL Chol Calc (NIH): 169 mg/dL — ABNORMAL HIGH (ref 0–99)
Triglycerides: 103 mg/dL (ref 0–149)
VLDL Cholesterol Cal: 19 mg/dL (ref 5–40)

## 2019-08-23 LAB — CMP14+EGFR
ALT: 17 IU/L (ref 0–32)
AST: 14 IU/L (ref 0–40)
Albumin/Globulin Ratio: 1.2 (ref 1.2–2.2)
Albumin: 3.9 g/dL (ref 3.8–4.9)
Alkaline Phosphatase: 171 IU/L — ABNORMAL HIGH (ref 39–117)
BUN/Creatinine Ratio: 13 (ref 9–23)
BUN: 11 mg/dL (ref 6–24)
Bilirubin Total: <0.2 mg/dL (ref 0.0–1.2)
CO2: 20 mmol/L (ref 20–29)
Calcium: 9.1 mg/dL (ref 8.7–10.2)
Chloride: 103 mmol/L (ref 96–106)
Creatinine, Ser: 0.83 mg/dL (ref 0.57–1.00)
GFR calc Af Amer: 89 mL/min/{1.73_m2} (ref >59)
GFR calc non Af Amer: 77 mL/min/{1.73_m2} (ref >59)
Globulin, Total: 3.2 g/dL (ref 1.5–4.5)
Glucose: 154 mg/dL — ABNORMAL HIGH (ref 65–99)
Potassium: 4.3 mmol/L (ref 3.5–5.2)
Sodium: 138 mmol/L (ref 134–144)
Total Protein: 7.1 g/dL (ref 6.0–8.5)

## 2019-08-23 LAB — TSH: TSH: 0.624 u[IU]/mL (ref 0.450–4.500)

## 2019-08-23 LAB — VITAMIN B12: Vitamin B-12: 148 pg/mL — ABNORMAL LOW (ref 232–1245)

## 2019-08-30 ENCOUNTER — Other Ambulatory Visit: Payer: Self-pay

## 2019-08-30 DIAGNOSIS — E785 Hyperlipidemia, unspecified: Secondary | ICD-10-CM

## 2019-08-30 MED ORDER — ROSUVASTATIN CALCIUM 20 MG PO TABS: 20.0000 mg | ORAL_TABLET | Freq: Every day | ORAL | 3 refills | Status: DC

## 2019-08-30 NOTE — Progress Notes (Signed)
Robin Rotunda, MD  08/23/2019 3:35 PM EDT    Her LDL is elevated. Her Framingham risk score suggest 21.5% risk of cardiovascular events over the next 10 years which is high for her age and gender. I would suggest a goal LDL less than 70. His diagnosis dietary modifications I would suggest 20 mg of Crestor daily with a repeat liver and lipid in 10 weeks. Call Robin Keller with the results and send results to Robin Spencer, FNP

## 2019-09-21 ENCOUNTER — Ambulatory Visit (INDEPENDENT_AMBULATORY_CARE_PROVIDER_SITE_OTHER): Payer: 59 | Admitting: Family

## 2019-09-21 ENCOUNTER — Other Ambulatory Visit: Payer: Self-pay

## 2019-09-21 ENCOUNTER — Encounter: Payer: Self-pay | Admitting: Family

## 2019-09-21 VITALS — BP 125/82 | HR 99 | Temp 97.0°F | Wt 210.4 lb

## 2019-09-21 DIAGNOSIS — F5101 Primary insomnia: Secondary | ICD-10-CM

## 2019-09-21 DIAGNOSIS — Z1159 Encounter for screening for other viral diseases: Secondary | ICD-10-CM

## 2019-09-21 DIAGNOSIS — I10 Essential (primary) hypertension: Secondary | ICD-10-CM

## 2019-09-21 DIAGNOSIS — F17210 Nicotine dependence, cigarettes, uncomplicated: Secondary | ICD-10-CM | POA: Diagnosis not present

## 2019-09-21 DIAGNOSIS — D72829 Elevated white blood cell count, unspecified: Secondary | ICD-10-CM

## 2019-09-21 DIAGNOSIS — Z1212 Encounter for screening for malignant neoplasm of rectum: Secondary | ICD-10-CM

## 2019-09-21 DIAGNOSIS — Z1211 Encounter for screening for malignant neoplasm of colon: Secondary | ICD-10-CM

## 2019-09-21 MED ORDER — DOXEPIN HCL 25 MG PO CAPS: 25.0000 mg | ORAL_CAPSULE | Freq: Every evening | ORAL | 1 refills | Status: DC | PRN

## 2019-09-21 NOTE — Progress Notes (Signed)
Subjective:    Patient ID: Robin Keller, female    DOB: Mar 28, 1961, 59 y.o.   MRN: [id number]  Chief Complaint  Patient presents with  . Insomnia    4 week follow up from Dettinger Doxepin not working wants a change   . Peripheral Neuropathy    Stopped the Gabapentin   Pt presents to the office today to establish care with me. She is followed by Cardiologists annually. Her BP is stable today.  Insomnia Primary symptoms: difficulty falling asleep, frequent awakening.  The current episode started more than one year. The onset quality is gradual. The problem occurs intermittently. The problem has been waxing and waning since onset. The treatment provided moderate relief.  Nicotine Dependence Symptoms include insomnia. Her urge triggers include company of smokers.  COPD She is currently smoking 5 cigarettes a day. Having SOB when it is hot. Using Spiriva daily.      Review of Systems  Psychiatric/Behavioral: The patient has insomnia.   All other systems reviewed and are negative.      Objective:   Physical Exam Vitals reviewed.  Constitutional:      General: She is not in acute distress.    Appearance: She is well-developed. She is obese.  HENT:     Head: Normocephalic and atraumatic.     Right Ear: Tympanic membrane normal.     Left Ear: Tympanic membrane normal.  Eyes:     Pupils: Pupils are equal, round, and reactive to light.  Neck:     Thyroid: No thyromegaly.  Cardiovascular:     Rate and Rhythm: Normal rate and regular rhythm.     Heart sounds: Normal heart sounds. No murmur.  Pulmonary:     Effort: Pulmonary effort is normal. No respiratory distress.     Breath sounds: Normal breath sounds. No wheezing.  Abdominal:     General: Bowel sounds are normal. There is no distension.     Palpations: Abdomen is soft.     Tenderness: There is no abdominal tenderness.  Musculoskeletal:        General: No tenderness. Normal range of motion.     Cervical back: Normal  range of motion and neck supple.  Skin:    General: Skin is warm and dry.  Neurological:     Mental Status: She is alert and oriented to person, place, and time.     Cranial Nerves: No cranial nerve deficit.     Deep Tendon Reflexes: Reflexes are normal and symmetric.  Psychiatric:        Behavior: Behavior normal.        Thought Content: Thought content normal.        Judgment: Judgment normal.     BP 125/82   Pulse 99   Temp (!) 97 F (36.1 C) (Temporal)   Wt 210 lb 6.4 oz (95.4 kg)   SpO2 95%   BMI 39.75 kg/m        Assessment & Plan:  Robin Keller comes in today with chief complaint of Insomnia (4 week follow up from Dettinger Doxepin not working wants a change ) and Peripheral Neuropathy (Stopped the Gabapentin)   Diagnosis and orders addressed:  1. HYPERTENSION, BENIGN - CBC with Differential/Platelet - BMP8+EGFR  2. Cigarette smoker Smoking cessation discussed - CBC with Differential/Platelet - BMP8+EGFR  3. Morbid obesity due to excess calories ([place]) complicated by hbp/ doe - CBC with Differential/Platelet - BMP8+EGFR  4. Primary insomnia Will increase doxepin to 25 mg  from 10 mg Sleep ritual  - doxepin (SINEQUAN) 25 MG capsule; Take 1 capsule (25 mg total) by mouth at bedtime as needed.  Dispense: 90 capsule; Refill: 1 - CBC with Differential/Platelet - BMP8+EGFR  5. Colon cancer screening - Cologuard - CBC with Differential/Platelet - BMP8+EGFR  6. Screening for malignant neoplasm of the rectum - Cologuard - CBC with Differential/Platelet - BMP8+EGFR  7. Need for hepatitis C screening test - Hepatitis C antibody - CBC with Differential/Platelet - BMP8+EGFR  8. Leukocytosis, unspecified type Has been elevated on lab work for the last few years, could be related to her smoking. If still elevated today, will do referral to Hematologists.   Labs pending Health Maintenance reviewed Diet and exercise encouraged  Follow up plan: 6 months     Evelina Dun, FNP

## 2019-09-21 NOTE — Patient Instructions (Signed)

## 2019-09-22 LAB — BMP8+EGFR
BUN/Creatinine Ratio: 14 (ref 9–23)
BUN: 11 mg/dL (ref 6–24)
CO2: 20 mmol/L (ref 20–29)
Calcium: 8.9 mg/dL (ref 8.7–10.2)
Chloride: 105 mmol/L (ref 96–106)
Creatinine, Ser: 0.76 mg/dL (ref 0.57–1.00)
GFR calc Af Amer: 99 mL/min/{1.73_m2} (ref >59)
GFR calc non Af Amer: 86 mL/min/{1.73_m2} (ref >59)
Glucose: 201 mg/dL — ABNORMAL HIGH (ref 65–99)
Potassium: 4.5 mmol/L (ref 3.5–5.2)
Sodium: 140 mmol/L (ref 134–144)

## 2019-09-22 LAB — CBC WITH DIFFERENTIAL/PLATELET
Basophils Absolute: 0.1 10*3/uL (ref 0.0–0.2)
Basos: 1 %
EOS (ABSOLUTE): 0.3 10*3/uL (ref 0.0–0.4)
Eos: 3 %
Hematocrit: 41.7 % (ref 34.0–46.6)
Hemoglobin: 13.4 g/dL (ref 11.1–15.9)
Immature Grans (Abs): 0 10*3/uL (ref 0.0–0.1)
Immature Granulocytes: 0 %
Lymphocytes Absolute: 2.9 10*3/uL (ref 0.7–3.1)
Lymphs: 24 %
MCH: 28.7 pg (ref 26.6–33.0)
MCHC: 32.1 g/dL (ref 31.5–35.7)
MCV: 89 fL (ref 79–97)
Monocytes Absolute: 0.6 10*3/uL (ref 0.1–0.9)
Monocytes: 5 %
Neutrophils Absolute: 8.3 10*3/uL — ABNORMAL HIGH (ref 1.4–7.0)
Neutrophils: 67 %
Platelets: 286 10*3/uL (ref 150–450)
RBC: 4.67 x10E6/uL (ref 3.77–5.28)
RDW: 13.2 % (ref 11.7–15.4)
WBC: 12.1 10*3/uL — ABNORMAL HIGH (ref 3.4–10.8)

## 2019-09-22 LAB — HEPATITIS C ANTIBODY: Hep C Virus Ab: <0.1 s/co ratio (ref 0.0–0.9)

## 2019-09-23 ENCOUNTER — Other Ambulatory Visit: Payer: Self-pay | Admitting: Family

## 2019-09-23 DIAGNOSIS — D72829 Elevated white blood cell count, unspecified: Secondary | ICD-10-CM

## 2019-09-27 ENCOUNTER — Other Ambulatory Visit: Payer: Self-pay | Admitting: Family

## 2019-09-27 DIAGNOSIS — E1169 Type 2 diabetes mellitus with other specified complication: Secondary | ICD-10-CM

## 2019-09-27 DIAGNOSIS — E119 Type 2 diabetes mellitus without complications: Secondary | ICD-10-CM | POA: Insufficient documentation

## 2019-09-27 LAB — HGB A1C W/O EAG: Hgb A1c MFr Bld: 7 % — ABNORMAL HIGH (ref 4.8–5.6)

## 2019-09-27 LAB — SPECIMEN STATUS REPORT

## 2019-09-29 ENCOUNTER — Encounter: Payer: Self-pay | Admitting: Pharmacist

## 2019-09-29 ENCOUNTER — Ambulatory Visit: Payer: 59 | Admitting: Pharmacist

## 2019-09-29 ENCOUNTER — Other Ambulatory Visit: Payer: Self-pay

## 2019-09-29 VITALS — BP 158/93 | HR 95

## 2019-09-29 DIAGNOSIS — E1169 Type 2 diabetes mellitus with other specified complication: Secondary | ICD-10-CM

## 2019-09-29 MED ORDER — METFORMIN HCL ER 500 MG PO TB24: 500.0000 mg | ORAL_TABLET | Freq: Every day | ORAL | 4 refills | Status: DC

## 2019-09-29 NOTE — Progress Notes (Signed)
     09/29/2019 Name: Robin Keller MRN: [id number] DOB: 23-Jan-1961   S:  47 yoF presents for diabetes evaluation, education, and management Patient was referred and last seen by Primary Care Provider in June 2021. Patient reports Diabetes was diagnosed in 09/2019.  She is motivated to control her condition.  Insurance coverage/medication affordability: [employer] commerical  Patient reports adherence with medications. . Current diabetes medications include: n/a . Current hypertension medications include: n/a Goal 130/80 . Current hyperlipidemia medications include: rosuvstatin (recently started) LDL 169 on 08/2019   Patient denies hypoglycemic events.   Patient reported dietary habits: Eats 2-3 meals/day Discussed meal planning options and Plate method for health eating  Avoid sugary drinks and desserts  Drinks: pepsi (pt has decreased intake, she is motivated)  Incorporate balanced protein, non starchy veggies, 1 serving of carbohydrate  Increase water intake  Increase physical activity as able.  Patient-reported exercise habits: n/a  Patient denies nocturia (nighttime urination).  Patient denies neuropathy (nerve pain).  Patient denies visual changes.  Patient denies self foot exams.    O:  Lab Results  Component Value Date   HGBA1C 7.0 (H) 09/21/2019    Vitals:   09/29/19 1104  BP: (!) 158/93  Pulse: 95    Lipid Panel     Component Value Date/Time   CHOL 226 (H) 08/22/2019 1104   TRIG 103 08/22/2019 1104   HDL 38 (L) 08/22/2019 1104   CHOLHDL 5.9 (H) 08/22/2019 1104   LDLCALC 169 (H) 08/22/2019 1104     Home fasting blood sugars: n/a  2 hour post-meal/random blood sugars: n/a.     A/P:  Diabetes T2DM NEWLY DIAGNOSED  -STARTED metformin XR 500mg  daily with breakfast  -Extensively discussed pathophysiology of diabetes, recommended lifestyle interventions, dietary effects on blood sugar control  -Counseled on s/sx of and management of  hypoglycemia  -Next A1C anticipated 12/2019  Written patient instructions provided.  Total time in face to face counseling 29 minutes.   Follow up Pharmacist Clinic Visit ON 12/27/19.  Will repeat A1c  02/26/20, PharmD, BCPS Clinical Pharmacist, [facility]  II Phone [phone]   .

## 2019-10-05 NOTE — Progress Notes (Signed)
[place] [address], [place] [address]   CLINIC:  Medical Oncology/Hematology  Patient Care Team: Sharion Balloon, FNP as PCP - General (Family Medicine) Minus Breeding, MD as PCP - Cardiology (Cardiology) Lavera Guise, [facility] (Pharmacist)  CHIEF COMPLAINTS/PURPOSE OF CONSULTATION:  Evaluation of leukocytosis  HISTORY OF PRESENTING ILLNESS:  Robin Keller 59 y.o. female is here because of evaluation of leukocytosis, at the request of Evelina Dun at [place]. It has been noted that she has been having leukocytosis on her labwork for the past several years.  CBC on 09/21/2019 shows white count of 12.1 with differential showing increased absolute neutrophil count.  There were occasions when her absolute lymphocyte count was also elevated.  Hemoglobin and platelet count was normal.  Denies any history of connective tissue disorders.  Denies any fevers, night sweats or weight loss in the last 6 months.  Patient is a current active smoker, 1 pack/day for 32 years.  Denies any work-related chemical exposure.  Reports some shortness of breath on exertion from COPD.  Denies any exposure to chemicals from her work.  Family history significant for maternal uncle with oral cancer.    MEDICAL HISTORY:  Past Medical History:  Diagnosis Date  . Arthralgia of both knees 12/09/2016  . CHEST PAIN, ATYPICAL, HX OF 08/07/2008   Qualifier: Diagnosis of  By: Johnsie Cancel, MD, Rona Ravens   . Chronic radicular lumbar pain 03/02/2017  . Cigarette smoker 08/07/2008   Qualifier: Diagnosis of  By: Johnsie Cancel, MD, Rona Ravens   . COPD (chronic obstructive pulmonary disease) ([place]) 05/25/2017  . DOE (dyspnea on exertion) 08/07/2008     PFT's   07/19/08  wnl  X slt flattening on insp portion of fv loop  - 06/04/2017  Walked RA x 3 laps @ 185 ft each stopped due to  End of study, fast pace, no  desat  But had to slow down on lap 3 due to sob   - Allergy profile   06/04/17  >  Eos 0.4 /  IgE  374 RAST neg  - CTa 06/08/2017  Nl    . Extremity atherosclerosis with intermittent claudication ([place]) 03/02/2017  . HYPERTENSION, BENIGN 08/07/2008   Qualifier: Diagnosis of  By: Johnsie Cancel, MD, Rona Ravens   . Morbid obesity due to excess calories ([place]) complicated by hbp/ doe 05/17/5168  . Myalgia 12/09/2016  . Pain in joints of both feet 12/09/2016  . Palpitations 05/25/2017  . PULMONARY NODULE 08/07/2008   Qualifier: Diagnosis of  By: Gwenette Greet MD, Armando Reichert     SURGICAL HISTORY: Past Surgical History:  Procedure Laterality Date  . ABDOMINAL HYSTERECTOMY    . BACK SURGERY    . CHOLECYSTECTOMY    . LEFT HEART CATH AND CORONARY ANGIOGRAPHY N/A 07/27/2017   Procedure: LEFT HEART CATH AND CORONARY ANGIOGRAPHY;  Surgeon: Leonie Man, MD;  Location: [facility];  Service: Cardiovascular;  Laterality: N/A;  . SKIN CANCER EXCISION      SOCIAL HISTORY: Social History   Socioeconomic History  . Marital status: Married    Spouse name: Not on file  . Number of children: 2  . Years of education: Not on file  . Highest education level: Not on file  Occupational History  . Occupation: EMPLOYED  Tobacco Use  . Smoking status: Current Every Day Smoker    Packs/day: 0.30    Years: 32.00    Pack years: 9.60  Types: Cigarettes  . Smokeless tobacco: Never Used  . Tobacco comment: Using patches.   Vaping Use  . Vaping Use: Never used  Substance and Sexual Activity  . Alcohol use: No  . Drug use: No  . Sexual activity: Yes    Birth control/protection: Surgical  Other Topics Concern  . Not on file  Social History Narrative  . Not on file   Social Determinants of Health   Financial Resource Strain:   . Difficulty of Paying Living Expenses:   Food Insecurity:   . Worried About Charity fundraiser in the Last Year:   . Arboriculturist in the Last Year:   Transportation Needs:   . Film/video editor (Medical):   Marland Kitchen Lack of Transportation  (Non-Medical):   Physical Activity:   . Days of Exercise per Week:   . Minutes of Exercise per Session:   Stress:   . Feeling of Stress :   Social Connections:   . Frequency of Communication with Friends and Family:   . Frequency of Social Gatherings with Friends and Family:   . Attends Religious Services:   . Active Member of Clubs or Organizations:   . Attends Archivist Meetings:   Marland Kitchen Marital Status:   Intimate Partner Violence:   . Fear of Current or Ex-Partner:   . Emotionally Abused:   Marland Kitchen Physically Abused:   . Sexually Abused:     FAMILY HISTORY: Family History  Problem Relation Age of Onset  . Hypertension Mother   . Diabetes Mother   . Hyperlipidemia Mother   . Stroke Mother   . Diabetes Father   . Healthy Sister   . Healthy Daughter   . COPD Maternal Grandmother   . Heart attack Maternal Grandmother   . Healthy Sister   . Healthy Daughter   . Heart attack Maternal Grandfather     ALLERGIES:  has No Known Allergies.  MEDICATIONS:  Current Outpatient Medications  Medication Sig Dispense Refill  . cyanocobalamin 1000 MCG tablet Take 1,000 mcg by mouth daily.    . metFORMIN (GLUCOPHAGE XR) 500 MG 24 hr tablet Take 1 tablet (500 mg total) by mouth daily with breakfast. 90 tablet 4  . rosuvastatin (CRESTOR) 20 MG tablet Take 1 tablet (20 mg total) by mouth daily. 90 tablet 3  . tiotropium (SPIRIVA HANDIHALER) 18 MCG inhalation capsule Place 1 capsule (18 mcg total) into inhaler and inhale daily. 30 capsule 12  . acetaminophen (TYLENOL) 500 MG tablet Take 1,000 mg by mouth every 6 (six) hours as needed for moderate pain or headache. (Patient not taking: Reported on 10/06/2019)    . albuterol (VENTOLIN HFA) 108 (90 Base) MCG/ACT inhaler Inhale 2 puffs into the lungs every 6 (six) hours as needed for wheezing or shortness of breath. (Patient not taking: Reported on 10/06/2019) 18 g 3  . doxepin (SINEQUAN) 25 MG capsule Take 1 capsule (25 mg total) by mouth at  bedtime as needed. (Patient not taking: Reported on 10/06/2019) 90 capsule 1   No current facility-administered medications for this visit.    REVIEW OF SYSTEMS:   Review of Systems - Oncology   PHYSICAL EXAMINATION: ECOG PERFORMANCE STATUS: 0 - Asymptomatic  Vitals:   10/06/19 0819  BP: 139/72  Pulse: 97  Resp: 19  Temp: (!) 97.1 F (36.2 C)  SpO2: 98%   Filed Weights   10/06/19 0819  Weight: 212 lb 11.2 oz (96.5 kg)   Physical Exam Vitals reviewed.  Constitutional:      Appearance: Normal appearance.  Cardiovascular:     Rate and Rhythm: Normal rate and regular rhythm.     Heart sounds: Normal heart sounds.  Pulmonary:     Effort: Pulmonary effort is normal.     Breath sounds: Normal breath sounds.  Abdominal:     General: There is no distension.     Palpations: Abdomen is soft. There is no mass.  Skin:    General: Skin is warm.  Neurological:     General: No focal deficit present.     Mental Status: She is alert and oriented to person, place, and time.  Psychiatric:        Mood and Affect: Mood normal.        Behavior: Behavior normal.      LABORATORY DATA:  I have reviewed the data as listed Recent Results (from the past 2160 hour(s))  CBC with Differential/Platelet     Status: Abnormal   Collection Time: 08/22/19 11:04 AM  Result Value Ref Range   WBC 12.9 (H) 3.4 - 10.8 x10E3/uL   RBC 4.58 3.77 - 5.28 x10E6/uL   Hemoglobin 13.7 11.1 - 15.9 g/dL   Hematocrit 40.1 34.0 - 46.6 %   MCV 88 79 - 97 fL   MCH 29.9 26.6 - 33.0 pg   MCHC 34.2 31 - 35 g/dL   RDW 13.3 11.7 - 15.4 %   Platelets 326 150 - 450 x10E3/uL   Neutrophils 63 Not Estab. %   Lymphs 30 Not Estab. %   Monocytes 4 Not Estab. %   Eos 2 Not Estab. %   Basos 1 Not Estab. %   Neutrophils Absolute 8.1 (H) 1 - 7 x10E3/uL   Lymphocytes Absolute 3.9 (H) 0 - 3 x10E3/uL   Monocytes Absolute 0.5 0 - 0 x10E3/uL   EOS (ABSOLUTE) 0.3 0.0 - 0.4 x10E3/uL   Basophils Absolute 0.1 0 - 0 x10E3/uL    Immature Granulocytes 0 Not Estab. %   Immature Grans (Abs) 0.0 0.0 - 0.1 x10E3/uL  CMP14+EGFR     Status: Abnormal   Collection Time: 08/22/19 11:04 AM  Result Value Ref Range   Glucose 154 (H) 65 - 99 mg/dL   BUN 11 6 - 24 mg/dL   Creatinine, Ser 0.83 0.57 - 1.00 mg/dL   GFR calc non Af Amer 77 >59 mL/min/1.73   GFR calc Af Amer 89 >59 mL/min/1.73    Comment: **[facility] currently reports eGFR in compliance with the current**   recommendations of the [employer]. [facility] will   update reporting as new guidelines are published from the NKF-ASN   Task force.    BUN/Creatinine Ratio 13 9 - 23   Sodium 138 134 - 144 mmol/L   Potassium 4.3 3.5 - 5.2 mmol/L   Chloride 103 96 - 106 mmol/L   CO2 20 20 - 29 mmol/L   Calcium 9.1 8.7 - 10.2 mg/dL   Total Protein 7.1 6.0 - 8.5 g/dL   Albumin 3.9 3.8 - 4.9 g/dL   Globulin, Total 3.2 1.5 - 4.5 g/dL   Albumin/Globulin Ratio 1.2 1.2 - 2.2   Bilirubin Total <0.2 0.0 - 1.2 mg/dL   Alkaline Phosphatase 171 (H) 39 - 117 IU/L   AST 14 0 - 40 IU/L   ALT 17 0 - 32 IU/L  Lipid panel     Status: Abnormal   Collection Time: 08/22/19 11:04 AM  Result Value Ref Range   Cholesterol, Total  226 (H) 100 - 199 mg/dL   Triglycerides 103 0 - 149 mg/dL   HDL 38 (L) >39 mg/dL   VLDL Cholesterol Cal 19 5 - 40 mg/dL   LDL Chol Calc (NIH) 169 (H) 0 - 99 mg/dL   Chol/HDL Ratio 5.9 (H) 0.0 - 4.4 ratio    Comment:                                   T. Chol/HDL Ratio                                             Men  Women                               1/2 Avg.Risk  3.4    3.3                                   Avg.Risk  5.0    4.4                                2X Avg.Risk  9.6    7.1                                3X Avg.Risk 23.4   11.0   TSH     Status: None   Collection Time: 08/22/19 11:04 AM  Result Value Ref Range   TSH 0.624 0.450 - 4.500 uIU/mL  Vitamin B12     Status: Abnormal   Collection Time: 08/22/19 11:04 AM  Result Value Ref Range     Vitamin B-12 148 (L) 232 - 1,245 pg/mL  Hepatitis C antibody     Status: None   Collection Time: 09/21/19 10:54 AM  Result Value Ref Range   Hep C Virus Ab <0.1 0.0 - 0.9 s/co ratio    Comment:                                   Negative:     < 0.8                              Indeterminate: 0.8 - 0.9                                   Positive:     > 0.9  The CDC recommends that a positive HCV antibody result  be followed up with a HCV Nucleic Acid Amplification  test (726203).   CBC with Differential/Platelet     Status: Abnormal   Collection Time: 09/21/19 10:54 AM  Result Value Ref Range   WBC 12.1 (H) 3.4 - 10.8 x10E3/uL   RBC 4.67 3.77 - 5.28 x10E6/uL   Hemoglobin 13.4 11.1 - 15.9 g/dL   Hematocrit 41.7 34.0 - 46.6 %   MCV 89 79 - 97 fL  MCH 28.7 26.6 - 33.0 pg   MCHC 32.1 31 - 35 g/dL   RDW 13.2 11.7 - 15.4 %   Platelets 286 150 - 450 x10E3/uL   Neutrophils 67 Not Estab. %   Lymphs 24 Not Estab. %   Monocytes 5 Not Estab. %   Eos 3 Not Estab. %   Basos 1 Not Estab. %   Neutrophils Absolute 8.3 (H) 1 - 7 x10E3/uL   Lymphocytes Absolute 2.9 0 - 3 x10E3/uL   Monocytes Absolute 0.6 0 - 0 x10E3/uL   EOS (ABSOLUTE) 0.3 0.0 - 0.4 x10E3/uL   Basophils Absolute 0.1 0 - 0 x10E3/uL   Immature Granulocytes 0 Not Estab. %   Immature Grans (Abs) 0.0 0.0 - 0.1 x10E3/uL  BMP8+EGFR     Status: Abnormal   Collection Time: 09/21/19 10:54 AM  Result Value Ref Range   Glucose 201 (H) 65 - 99 mg/dL   BUN 11 6 - 24 mg/dL   Creatinine, Ser 0.76 0.57 - 1.00 mg/dL   GFR calc non Af Amer 86 >59 mL/min/1.73   GFR calc Af Amer 99 >59 mL/min/1.73    Comment: **[facility] currently reports eGFR in compliance with the current**   recommendations of the [employer]. [facility] will   update reporting as new guidelines are published from the NKF-ASN   Task force.    BUN/Creatinine Ratio 14 9 - 23   Sodium 140 134 - 144 mmol/L   Potassium 4.5 3.5 - 5.2 mmol/L   Chloride 105 96 -  106 mmol/L   CO2 20 20 - 29 mmol/L   Calcium 8.9 8.7 - 10.2 mg/dL  Hgb A1c w/o eAG     Status: Abnormal   Collection Time: 09/21/19 10:54 AM  Result Value Ref Range   Hgb A1c MFr Bld 7.0 (H) 4.8 - 5.6 %    Comment:          Prediabetes: 5.7 - 6.4          Diabetes: >6.4          Glycemic control for adults with diabetes: <7.0   Specimen status report     Status: None   Collection Time: 09/21/19 10:54 AM  Result Value Ref Range   specimen status report Comment     Comment: Written Authorization Written Authorization Written Authorization Received. Authorization received from [place] 09-26-2019 Logged by Valentino Saxon   C-reactive protein     Status: Abnormal   Collection Time: 10/06/19  9:45 AM  Result Value Ref Range   CRP 2.4 (H) <1.0 mg/dL    Comment: Performed at [facility], [address] [place], [place] [address]  Sedimentation rate     Status: Abnormal   Collection Time: 10/06/19  9:45 AM  Result Value Ref Range   Sed Rate 49 (H) 0 - 22 mm/hr    Comment: Performed at [facility], [address], [place], [place] [address]  Lactate dehydrogenase     Status: None   Collection Time: 10/06/19  9:45 AM  Result Value Ref Range   LDH 136 98 - 192 U/L    Comment: Performed at [facility], [address] [place], [place] [address]  CBC with Differential     Status: Abnormal   Collection Time: 10/06/19  9:45 AM  Result Value Ref Range   WBC 12.4 (H) 4.0 - 10.5 K/uL   RBC 4.42 3.87 - 5.11 MIL/uL   Hemoglobin 13.3 12.0 - 15.0 g/dL  HCT 41.1 36 - 46 %   MCV 93.0 80.0 - 100.0 fL   MCH 30.1 26.0 - 34.0 pg   MCHC 32.4 30.0 - 36.0 g/dL   RDW 13.7 11.5 - 15.5 %   Platelets 270 150 - 400 K/uL   nRBC 0.0 0.0 - 0.2 %   Neutrophils Relative % 57 %   Neutro Abs 7.3 1.7 - 7.7 K/uL   Lymphocytes Relative 33 %   Lymphs Abs 4.1 (H) 0.7 - 4.0 K/uL   Monocytes Relative 5 %   Monocytes Absolute 0.6 0 - 1 K/uL   Eosinophils Relative 3 %   Eosinophils Absolute 0.3 0 - 0  K/uL   Basophils Relative 1 %   Basophils Absolute 0.1 0 - 0 K/uL   Immature Granulocytes 1 %   Abs Immature Granulocytes 0.06 0.00 - 0.07 K/uL    Comment: Performed at [facility], [address], [place], [place] [address]    RADIOGRAPHIC STUDIES: I have personally reviewed the radiological images as listed and agreed with the findings in the report.  ASSESSMENT:  1.  Neutrophilic leukocytosis: -Patient seen at the request of Evelina Dun, FNP for leukocytosis. -She has mildly elevated white count since August 2018. -Current active smoker, 1 pack/day for 32 years.  No recurrent infections.  No prior history of splenectomy.  No systemic steroid use. -CT PE protocol on 06/08/2017 shows no adenopathy.  No lung lesions.   PLAN:  1.  Leukocytosis: -She has elevated neutrophil count and occasionally elevated lymphocyte count. -We talked about the differential diagnosis of elevated white count. -We will repeat her CBC.  We will check for myeloproliferative disorders with JAK2 V617F and BCR/ABL by FISH. -We will also check flow cytometry.  We will send for ANA, rheumatoid factor, ESR and CRP for connective tissue disorder work-up. -If the above tests are negative likely etiology could be smoking.   All questions were answered. The patient knows to call the clinic with any problems, questions or concerns.   Derek Jack, MD 10/06/19 7:02 PM  [place] [phone]   I, Milinda Antis, am acting as a scribe for Dr. Sanda Linger.  I, Derek Jack MD, have reviewed the above documentation for accuracy and completeness, and I agree with the above.

## 2019-10-06 ENCOUNTER — Inpatient Hospital Stay (HOSPITAL_COMMUNITY): Payer: 59

## 2019-10-06 ENCOUNTER — Encounter (HOSPITAL_COMMUNITY): Payer: Self-pay | Admitting: Hematology

## 2019-10-06 ENCOUNTER — Other Ambulatory Visit: Payer: Self-pay

## 2019-10-06 ENCOUNTER — Inpatient Hospital Stay (HOSPITAL_COMMUNITY): Payer: 59 | Attending: Hematology | Admitting: Hematology

## 2019-10-06 VITALS — BP 139/72 | HR 97 | Temp 97.1°F | Resp 19 | Ht 63.5 in | Wt 212.7 lb

## 2019-10-06 DIAGNOSIS — Z808 Family history of malignant neoplasm of other organs or systems: Secondary | ICD-10-CM | POA: Diagnosis not present

## 2019-10-06 DIAGNOSIS — J449 Chronic obstructive pulmonary disease, unspecified: Secondary | ICD-10-CM | POA: Diagnosis not present

## 2019-10-06 DIAGNOSIS — F17210 Nicotine dependence, cigarettes, uncomplicated: Secondary | ICD-10-CM | POA: Insufficient documentation

## 2019-10-06 DIAGNOSIS — D72829 Elevated white blood cell count, unspecified: Secondary | ICD-10-CM | POA: Insufficient documentation

## 2019-10-06 DIAGNOSIS — Z9081 Acquired absence of spleen: Secondary | ICD-10-CM | POA: Diagnosis not present

## 2019-10-06 LAB — CBC WITH DIFFERENTIAL/PLATELET
Abs Immature Granulocytes: 0.06 10*3/uL (ref 0.00–0.07)
Basophils Absolute: 0.1 10*3/uL (ref 0.0–0.1)
Basophils Relative: 1 %
Eosinophils Absolute: 0.3 10*3/uL (ref 0.0–0.5)
Eosinophils Relative: 3 %
HCT: 41.1 % (ref 36.0–46.0)
Hemoglobin: 13.3 g/dL (ref 12.0–15.0)
Immature Granulocytes: 1 %
Lymphocytes Relative: 33 %
Lymphs Abs: 4.1 10*3/uL — ABNORMAL HIGH (ref 0.7–4.0)
MCH: 30.1 pg (ref 26.0–34.0)
MCHC: 32.4 g/dL (ref 30.0–36.0)
MCV: 93 fL (ref 80.0–100.0)
Monocytes Absolute: 0.6 10*3/uL (ref 0.1–1.0)
Monocytes Relative: 5 %
Neutro Abs: 7.3 10*3/uL (ref 1.7–7.7)
Neutrophils Relative %: 57 %
Platelets: 270 10*3/uL (ref 150–400)
RBC: 4.42 MIL/uL (ref 3.87–5.11)
RDW: 13.7 % (ref 11.5–15.5)
WBC: 12.4 10*3/uL — ABNORMAL HIGH (ref 4.0–10.5)
nRBC: 0 % (ref 0.0–0.2)

## 2019-10-06 LAB — SEDIMENTATION RATE: Sed Rate: 49 mm/hr — ABNORMAL HIGH (ref 0–22)

## 2019-10-06 LAB — C-REACTIVE PROTEIN: CRP: 2.4 mg/dL — ABNORMAL HIGH (ref <1.0)

## 2019-10-06 LAB — LACTATE DEHYDROGENASE: LDH: 136 U/L (ref 98–192)

## 2019-10-06 NOTE — Patient Instructions (Signed)
[facility] at [facility] Discharge Instructions  You were seen today by Dr. Ellin Saba. He went over your recent results and possible diagnoses. You will have labs drawn for analysis. Continue cutting down on your smoking. Continue your routine care with your primary care provider.  Dr. Ellin Saba will see you back in 4 weeks for labs and follow up.   Thank you for choosing  Cancer Center at [facility] to provide your oncology and hematology care.  To afford each patient quality time with our provider, please arrive at least 15 minutes before your scheduled appointment time.   If you have a lab appointment with the Cancer Center please come in thru the Main Entrance and check in at the main information desk  You need to re-schedule your appointment should you arrive 10 or more minutes late.  We strive to give you quality time with our providers, and arriving late affects you and other patients whose appointments are after yours.  Also, if you no show three or more times for appointments you may be dismissed from the clinic at the providers discretion.     Again, thank you for choosing [facility].  Our hope is that these requests will decrease the amount of time that you wait before being seen by our physicians.       _____________________________________________________________  Should you have questions after your visit to [facility], please contact our office at [phone] between the hours of 8:00 a.m. and 4:30 p.m.  Voicemails left after 4:00 p.m. will not be returned until the following business day.  For prescription refill requests, have your pharmacy contact our office and allow 72 hours.    Cancer Center Support Programs:   > Cancer Support Group  2nd Tuesday of the month 1pm-2pm, Journey Room

## 2019-10-07 LAB — ANTINUCLEAR ANTIBODIES, IFA: ANA Ab, IFA: NEGATIVE

## 2019-10-07 LAB — RHEUMATOID FACTOR: Rheumatoid fact SerPl-aCnc: <10 IU/mL (ref 0.0–13.9)

## 2019-10-10 LAB — SURGICAL PATHOLOGY

## 2019-10-11 LAB — EXTERNAL GENERIC LAB PROCEDURE: COLOGUARD: NEGATIVE

## 2019-10-12 LAB — FLOW CYTOMETRY

## 2019-10-14 LAB — CALR + JAK2 E12-15 + MPL (REFLEXED)

## 2019-10-14 LAB — BCR-ABL1, CML/ALL, PCR, QUANT: Interpretation (BCRAL):: NEGATIVE

## 2019-10-14 LAB — JAK2 V617F, W REFLEX TO CALR/E12/MPL

## 2019-10-20 LAB — COLOGUARD: Cologuard: NEGATIVE

## 2019-11-07 ENCOUNTER — Other Ambulatory Visit: Payer: Self-pay

## 2019-11-07 ENCOUNTER — Inpatient Hospital Stay (HOSPITAL_COMMUNITY): Payer: 59 | Attending: Hematology | Admitting: Hematology

## 2019-11-07 VITALS — BP 141/81 | HR 96 | Temp 98.1°F | Resp 20 | Wt 215.8 lb

## 2019-11-07 DIAGNOSIS — Z7984 Long term (current) use of oral hypoglycemic drugs: Secondary | ICD-10-CM | POA: Insufficient documentation

## 2019-11-07 DIAGNOSIS — J449 Chronic obstructive pulmonary disease, unspecified: Secondary | ICD-10-CM | POA: Diagnosis not present

## 2019-11-07 DIAGNOSIS — Z79899 Other long term (current) drug therapy: Secondary | ICD-10-CM | POA: Insufficient documentation

## 2019-11-07 DIAGNOSIS — F17210 Nicotine dependence, cigarettes, uncomplicated: Secondary | ICD-10-CM | POA: Insufficient documentation

## 2019-11-07 DIAGNOSIS — D72829 Elevated white blood cell count, unspecified: Secondary | ICD-10-CM | POA: Insufficient documentation

## 2019-11-07 DIAGNOSIS — I10 Essential (primary) hypertension: Secondary | ICD-10-CM | POA: Diagnosis not present

## 2019-11-07 NOTE — Patient Instructions (Signed)
[facility] at [facility] Discharge Instructions  You were seen today by Dr. Ellin Saba. He went over your recent results. You will be referred to have lung cancer screening. Dr. Ellin Saba will see you back in 4 months for labs and follow up.   Thank you for choosing [facility] at [facility] to provide your oncology and hematology care.  To afford each patient quality time with our provider, please arrive at least 15 minutes before your scheduled appointment time.   If you have a lab appointment with the Cancer Center please come in thru the Main Entrance and check in at the main information desk  You need to re-schedule your appointment should you arrive 10 or more minutes late.  We strive to give you quality time with our providers, and arriving late affects you and other patients whose appointments are after yours.  Also, if you no show three or more times for appointments you may be dismissed from the clinic at the providers discretion.     Again, thank you for choosing [facility].  Our hope is that these requests will decrease the amount of time that you wait before being seen by our physicians.       _____________________________________________________________  Should you have questions after your visit to [facility], please contact our office at [phone] between the hours of 8:00 a.m. and 4:30 p.m.  Voicemails left after 4:00 p.m. will not be returned until the following business day.  For prescription refill requests, have your pharmacy contact our office and allow 72 hours.    Cancer Center Support Programs:   > Cancer Support Group  2nd Tuesday of the month 1pm-2pm, Journey Room

## 2019-11-07 NOTE — Progress Notes (Signed)
[address] [place], [place] [address]   CLINIC:  Medical Oncology/Hematology  PCP:  Sharion Balloon, [place] / [place] [address]  [phone]  REASON FOR VISIT:  Follow-up for leukocytosis  PRIOR THERAPY: None  CURRENT THERAPY: Observation  INTERVAL HISTORY:  Ms. Robin Keller, a 59 y.o. female, returns for routine follow-up for her leukocytosis. Kh[place]esi was last seen on 10/06/2019.  Today she reports feeling well. She denies having any F/C, recent infections, though she is complaining of feet swelling and almost daily night sweats. Her cough is stable. She did not have to go to the ED recently.  She continues smoking 1/2 PPD.   REVIEW OF SYSTEMS:  Review of Systems  Constitutional: Positive for appetite change (severely decreased), diaphoresis (night sweats) and fatigue (depleted). Negative for chills and fever.  Respiratory: Positive for cough (stable).   Cardiovascular: Positive for leg swelling (up to the knees).  Musculoskeletal: Positive for arthralgias (6/10 bilat leg pain).  Skin: Negative for rash.  Neurological: Positive for numbness.  Hematological: Negative for adenopathy.  All other systems reviewed and are negative.   PAST MEDICAL/SURGICAL HISTORY:  Past Medical History:  Diagnosis Date  . Arthralgia of both knees 12/09/2016  . CHEST PAIN, ATYPICAL, HX OF 08/07/2008   Qualifier: Diagnosis of  By: Johnsie Cancel, MD, Rona Ravens   . Chronic radicular lumbar pain 03/02/2017  . Cigarette smoker 08/07/2008   Qualifier: Diagnosis of  By: Johnsie Cancel, MD, Rona Ravens   . COPD (chronic obstructive pulmonary disease) ([place]) 05/25/2017  . DOE (dyspnea on exertion) 08/07/2008     PFT's   07/19/08  wnl  X slt flattening on insp portion of fv loop  - 06/04/2017  Walked RA x 3 laps @ 185 ft each stopped due to  End of study, fast pace, no  desat  But had to slow down on lap 3 due to sob   - Allergy profile  06/04/17  >  Eos  0.4 /  IgE  374 RAST neg  - CTa 06/08/2017  Nl    . Extremity atherosclerosis with intermittent claudication ([place]) 03/02/2017  . HYPERTENSION, BENIGN 08/07/2008   Qualifier: Diagnosis of  By: Johnsie Cancel, MD, Rona Ravens   . Morbid obesity due to excess calories ([place]) complicated by hbp/ doe 7/54/4920  . Myalgia 12/09/2016  . Pain in joints of both feet 12/09/2016  . Palpitations 05/25/2017  . PULMONARY NODULE 08/07/2008   Qualifier: Diagnosis of  By: Gwenette Greet MD, Armando Reichert    Past Surgical History:  Procedure Laterality Date  . ABDOMINAL HYSTERECTOMY    . BACK SURGERY    . CHOLECYSTECTOMY    . LEFT HEART CATH AND CORONARY ANGIOGRAPHY N/A 07/27/2017   Procedure: LEFT HEART CATH AND CORONARY ANGIOGRAPHY;  Surgeon: Leonie Man, MD;  Location: [facility];  Service: Cardiovascular;  Laterality: N/A;  . SKIN CANCER EXCISION      SOCIAL HISTORY:  Social History   Socioeconomic History  . Marital status: Married    Spouse name: Not on file  . Number of children: 2  . Years of education: Not on file  . Highest education level: Not on file  Occupational History  . Occupation: EMPLOYED  Tobacco Use  . Smoking status: Current Every Day Smoker    Packs/day: 0.30    Years: 32.00    Pack years: 9.60    Types: Cigarettes  . Smokeless tobacco: Never  Used  . Tobacco comment: Using patches.   Vaping Use  . Vaping Use: Never used  Substance and Sexual Activity  . Alcohol use: No  . Drug use: No  . Sexual activity: Yes    Birth control/protection: Surgical  Other Topics Concern  . Not on file  Social History Narrative  . Not on file   Social Determinants of Health   Financial Resource Strain:   . Difficulty of Paying Living Expenses:   Food Insecurity:   . Worried About Charity fundraiser in the Last Year:   . Arboriculturist in the Last Year:   Transportation Needs:   . Film/video editor (Medical):   Marland Kitchen Lack of Transportation (Non-Medical):   Physical Activity:   .  Days of Exercise per Week:   . Minutes of Exercise per Session:   Stress:   . Feeling of Stress :   Social Connections:   . Frequency of Communication with Friends and Family:   . Frequency of Social Gatherings with Friends and Family:   . Attends Religious Services:   . Active Member of Clubs or Organizations:   . Attends Archivist Meetings:   Marland Kitchen Marital Status:   Intimate Partner Violence:   . Fear of Current or Ex-Partner:   . Emotionally Abused:   Marland Kitchen Physically Abused:   . Sexually Abused:     FAMILY HISTORY:  Family History  Problem Relation Age of Onset  . Hypertension Mother   . Diabetes Mother   . Hyperlipidemia Mother   . Stroke Mother   . Diabetes Father   . Healthy Sister   . Healthy Daughter   . COPD Maternal Grandmother   . Heart attack Maternal Grandmother   . Healthy Sister   . Healthy Daughter   . Heart attack Maternal Grandfather     CURRENT MEDICATIONS:  Current Outpatient Medications  Medication Sig Dispense Refill  . cyanocobalamin 1000 MCG tablet Take 1,000 mcg by mouth daily.    . metFORMIN (GLUCOPHAGE XR) 500 MG 24 hr tablet Take 1 tablet (500 mg total) by mouth daily with breakfast. 90 tablet 4  . rosuvastatin (CRESTOR) 20 MG tablet Take 1 tablet (20 mg total) by mouth daily. 90 tablet 3  . tiotropium (SPIRIVA HANDIHALER) 18 MCG inhalation capsule Place 1 capsule (18 mcg total) into inhaler and inhale daily. 30 capsule 12  . acetaminophen (TYLENOL) 500 MG tablet Take 1,000 mg by mouth every 6 (six) hours as needed for moderate pain or headache. (Patient not taking: Reported on 10/06/2019)    . albuterol (VENTOLIN HFA) 108 (90 Base) MCG/ACT inhaler Inhale 2 puffs into the lungs every 6 (six) hours as needed for wheezing or shortness of breath. (Patient not taking: Reported on 10/06/2019) 18 g 3  . doxepin (SINEQUAN) 25 MG capsule Take 1 capsule (25 mg total) by mouth at bedtime as needed. (Patient not taking: Reported on 10/06/2019) 90 capsule  1   No current facility-administered medications for this visit.    ALLERGIES:  No Known Allergies  PHYSICAL EXAM:  Performance status (ECOG): 0 - Asymptomatic  Vitals:   11/07/19 1158  BP: (!) 141/81  Pulse: 96  Resp: 20  Temp: 98.1 F (36.7 C)  SpO2: 98%   Wt Readings from Last 3 Encounters:  11/07/19 215 lb 13.3 oz (97.9 kg)  10/06/19 212 lb 11.2 oz (96.5 kg)  09/21/19 210 lb 6.4 oz (95.4 kg)   Physical Exam Vitals reviewed.  Constitutional:      Appearance: Normal appearance. She is obese.  Cardiovascular:     Rate and Rhythm: Normal rate and regular rhythm.     Pulses: Normal pulses.     Heart sounds: Normal heart sounds.  Pulmonary:     Effort: Pulmonary effort is normal.     Breath sounds: Normal breath sounds.  Musculoskeletal:     Right lower leg: No edema.     Left lower leg: No edema.  Lymphadenopathy:     Cervical: No cervical adenopathy.     Upper Body:     Right upper body: No supraclavicular or axillary adenopathy.     Left upper body: No supraclavicular or axillary adenopathy.  Neurological:     General: No focal deficit present.     Mental Status: She is alert and oriented to person, place, and time.  Psychiatric:        Mood and Affect: Mood normal.        Behavior: Behavior normal.     LABORATORY DATA:  I have reviewed the labs as listed.  CBC Latest Ref Rng & Units 10/06/2019 09/21/2019 08/22/2019  WBC 4.0 - 10.5 K/uL 12.4(H) 12.1(H) 12.9(H)  Hemoglobin 12.0 - 15.0 g/dL 13.3 13.4 13.7  Hematocrit 36 - 46 % 41.1 41.7 40.1  Platelets 150 - 400 K/uL 270 286 326   CMP Latest Ref Rng & Units 09/21/2019 08/22/2019 09/30/2017  Glucose 65 - 99 mg/dL 201(H) 154(H) 137(H)  BUN 6 - 24 mg/dL 11 11 9   Creatinine 0.57 - 1.00 mg/dL 0.76 0.83 0.68  Sodium 134 - 144 mmol/L 140 138 140  Potassium 3.5 - 5.2 mmol/L 4.5 4.3 4.2  Chloride 96 - 106 mmol/L 105 103 104  CO2 20 - 29 mmol/L 20 20 18(L)  Calcium 8.7 - 10.2 mg/dL 8.9 9.1 9.0  Total Protein 6.0 - 8.5  g/dL - 7.1 6.9  Total Bilirubin 0.0 - 1.2 mg/dL - <0.2 <0.2  Alkaline Phos 39 - 117 IU/L - 171(H) 137(H)  AST 0 - 40 IU/L - 14 10  ALT 0 - 32 IU/L - 17 11      Component Value Date/Time   RBC 4.42 10/06/2019 0945   MCV 93.0 10/06/2019 0945   MCV 89 09/21/2019 1054   MCH 30.1 10/06/2019 0945   MCHC 32.4 10/06/2019 0945   RDW 13.7 10/06/2019 0945   RDW 13.2 09/21/2019 1054   LYMPHSABS 4.1 (H) 10/06/2019 0945   LYMPHSABS 2.9 09/21/2019 1054   MONOABS 0.6 10/06/2019 0945   EOSABS 0.3 10/06/2019 0945   EOSABS 0.3 09/21/2019 1054   BASOSABS 0.1 10/06/2019 0945   BASOSABS 0.1 09/21/2019 1054   Lab Results  Component Value Date   LDH 136 10/06/2019     DIAGNOSTIC IMAGING:  I have independently reviewed the scans and discussed with the patient. No results found.   ASSESSMENT:  1.  Neutrophilic leukocytosis: -Patient seen at the request of Evelina Dun, FNP for leukocytosis. -She has mildly elevated white count since August 2018. -Current active smoker, 1 pack/day for 32 years.  No recurrent infections.  No prior history of splenectomy.  No systemic steroid use. -CT PE protocol on 06/08/2017 shows no adenopathy.  No lung lesions. -JAK2 V617F and reflex testing was negative.  BCR/ABL was negative.  ANA and rheumatoid factor was negative.  ESR and CRP was elevated.   PLAN:  1.  Leukocytosis: -We reviewed lab results and discussed them in detail.  Repeat white count was  12.4 and stable. -ESR and CRP elevated indicating inflammation. -Myeloproliferative disorder was ruled out.  No further testing needed at this time.  Will consider bone marrow aspiration biopsy if there is significant changes from her baseline. -Likely etiology for her leukocytosis is smoking at this time. -We will see her back in 4 months for follow-up.  2.  Tobacco abuse: -We will refer her to our lung cancer screening program based on her smoking history.   Orders placed this encounter:  Orders Placed This  Encounter  Procedures  . CBC with Differential  . Lactate dehydrogenase     Derek Jack, MD [place] [phone]   I, Milinda Antis, am acting as a scribe for Dr. Sanda Linger.  I, Derek Jack MD, have reviewed the above documentation for accuracy and completeness, and I agree with the above.

## 2019-11-08 ENCOUNTER — Other Ambulatory Visit: Payer: 59

## 2019-11-08 ENCOUNTER — Encounter (HOSPITAL_COMMUNITY): Payer: Self-pay

## 2019-11-08 NOTE — Progress Notes (Signed)
Patient has been referred for the Lung Cancer Screening Program. Attempted to contact patient. Voicemail left requesting that the patient return my phone call.

## 2019-11-09 ENCOUNTER — Encounter (HOSPITAL_COMMUNITY): Payer: Self-pay

## 2019-11-09 LAB — LIPID PANEL
Chol/HDL Ratio: 3.9 ratio (ref 0.0–4.4)
Cholesterol, Total: 142 mg/dL (ref 100–199)
HDL: 36 mg/dL — ABNORMAL LOW (ref >39)
LDL Chol Calc (NIH): 82 mg/dL (ref 0–99)
Triglycerides: 132 mg/dL (ref 0–149)
VLDL Cholesterol Cal: 24 mg/dL (ref 5–40)

## 2019-11-09 LAB — HEPATIC FUNCTION PANEL
ALT: 19 IU/L (ref 0–32)
AST: 15 IU/L (ref 0–40)
Albumin: 3.8 g/dL (ref 3.8–4.9)
Alkaline Phosphatase: 150 IU/L — ABNORMAL HIGH (ref 48–121)
Bilirubin Total: <0.2 mg/dL (ref 0.0–1.2)
Bilirubin, Direct: 0.07 mg/dL (ref 0.00–0.40)
Total Protein: 7.1 g/dL (ref 6.0–8.5)

## 2019-11-09 NOTE — Progress Notes (Signed)
Patient returned my previous phone call regarding Lung Cancer Screening Program. Patient's smoking history obtained. Patient is a current smoker with a  22 pack year history. Patient does not qualify for the lung screening program. She was advised to contact her PCP should she have any issues arise. Referring provider aware.

## 2019-11-29 ENCOUNTER — Encounter: Payer: Self-pay | Admitting: Family

## 2019-11-29 ENCOUNTER — Other Ambulatory Visit: Payer: Self-pay

## 2019-11-29 ENCOUNTER — Ambulatory Visit: Payer: 59 | Admitting: Family

## 2019-11-29 VITALS — BP 132/85 | HR 100 | Temp 97.7°F | Ht 63.5 in | Wt 216.0 lb

## 2019-11-29 DIAGNOSIS — R609 Edema, unspecified: Secondary | ICD-10-CM

## 2019-11-29 DIAGNOSIS — R7982 Elevated C-reactive protein (CRP): Secondary | ICD-10-CM | POA: Diagnosis not present

## 2019-11-29 DIAGNOSIS — F17200 Nicotine dependence, unspecified, uncomplicated: Secondary | ICD-10-CM | POA: Diagnosis not present

## 2019-11-29 DIAGNOSIS — E1169 Type 2 diabetes mellitus with other specified complication: Secondary | ICD-10-CM

## 2019-11-29 DIAGNOSIS — R700 Elevated erythrocyte sedimentation rate: Secondary | ICD-10-CM | POA: Diagnosis not present

## 2019-11-29 DIAGNOSIS — I739 Peripheral vascular disease, unspecified: Secondary | ICD-10-CM | POA: Insufficient documentation

## 2019-11-29 DIAGNOSIS — R600 Localized edema: Secondary | ICD-10-CM | POA: Insufficient documentation

## 2019-11-29 DIAGNOSIS — I70219 Atherosclerosis of native arteries of extremities with intermittent claudication, unspecified extremity: Secondary | ICD-10-CM

## 2019-11-29 MED ORDER — LISINOPRIL 5 MG PO TABS: 5.0000 mg | ORAL_TABLET | Freq: Every day | ORAL | 3 refills | Status: DC

## 2019-11-29 NOTE — Patient Instructions (Signed)
Intermittent Claudication Intermittent claudication is pain in one or both legs that occurs when walking or exercising and goes away when resting. Intermittent claudication is a symptom of peripheral arterial disease (PAD). This condition is commonly treated with rest, medicine, and healthy lifestyle changes. If medical management does not improve symptoms, surgery can be done to restore blood flow (revascularization) to the affected leg. What are the causes?  This condition is caused by buildup of fatty material (plaque) within the major arteries in the body (atherosclerosis). Plaque makes arteries stiff and narrow, which prevents enough blood from reaching the leg muscles. Pain occurs when you walk or exercise because your muscles need (but cannot get) more blood when you are moving and exercising. What increases the risk? The following factors may make you more likely to develop this condition:  A family history of atherosclerosis.  A personal history of stroke or heart disease.  Older age.  Being inactive (sedentary lifestyle).  Being overweight.  Smoking cigarettes.  Having another health condition such as: ? Diabetes. ? High blood pressure. ? High cholesterol. What are the signs or symptoms? Symptoms of this condition may first develop in the lower leg, and then they may spread to the thigh, hip, buttock, or the back of the lower leg (calf) over time. Symptoms may include:  Aches or pains.  Cramps.  A feeling of tightness, weakness, or heaviness.  A wound on the lower leg or foot that heals poorly or does not heal. How is this diagnosed? This condition may be diagnosed based on:  Your symptoms.  Your medical history.  Tests, such as: ? Blood tests. ? Arterial duplex ultrasound. This test uses images of blood vessels and surrounding organs to evaluate blood flow within arteries. ? Angiogram. In this procedure, dye is injected into arteries and then X-rays are  taken. ? Magnetic resonance angiogram (MRA). In this procedure, strong magnets and radio waves are used instead of X-rays to create images of blood vessels and blood flow. ? CT angiogram (CTA). In this procedure, a large X-ray machine called a CT scanner takes detailed pictures of blood vessels that have been injected with dye. ? Ankle-brachial index (ABI) test. This procedure measures blood pressure in the leg during exercise and at rest. ? Exercise test. For this test, you will walk on a treadmill while tests are done (such as the ABI test) to evaluate how this condition affects your ability to walk or exercise. How is this treated? Treatment for this condition may involve treatment for the underlying cause, such as treatment for high blood pressure, high cholesterol, or diabetes. Treatment may include:  Lifestyle changes such as: ? Starting a supervised or home-based exercise program. ? Losing weight. ? Quitting smoking.  Medicines to help restore blood flow through your legs.  Blood vessel surgery (angioplasty) to restore blood flow around the blocked vessel. This is also known as endovascular therapy (EVT). This is only done if your intermittent claudication is caused by severe peripheral artery disease, a condition in which blood flow is severely or totally restricted by the narrowing of the arteries. Follow these instructions at home: Lifestyle   Maintain a healthy weight.  Eat a diet that is low in saturated fats and calories. Consider working with a diet and nutrition specialist (dietitian) to help you make healthy food choices.  Do not use any products that contain nicotine or tobacco, such as cigarettes and e-cigarettes. If you need help quitting, ask your health care provider.  If   your health care provider recommended an exercise program for you, follow it as directed. Your exercise program may involve: ? Walking 3 or more times a week. ? Walking until you have certain  symptoms of intermittent claudication. ? Resting until symptoms go away. ? Gradually increasing your walking time to about 50 minutes a day. General instructions  Work with your health care provider to manage any other health conditions you may have, including diabetes, high blood pressure, or high cholesterol.  Take over-the-counter and prescription medicines only as told by your health care provider.  Keep all follow-up visits as told by your health care provider. This is important. Contact a health care provider if:  Your pain does not go away with rest.  You have sores on your legs that do not heal or have a bad smell or pus coming from them.  Your condition gets worse or does not get better with treatment. Get help right away if:  You have chest pain.  You have difficulty breathing.  You develop arm weakness.  You have trouble speaking.  Your face begins to droop.  Your foot or leg is cold or it changes color.  Your foot or leg becomes numb. These symptoms may represent a serious problem that is an emergency. Do not wait to see if the symptoms will go away. Get medical help right away. Call your local emergency services (911 in the [place] Do not drive yourself to the hospital.  Summary  Intermittent claudication is pain in one or both legs that occurs when walking or exercising and goes away when resting.  This condition is caused by buildup of fatty material (plaque) within the major arteries in the body (atherosclerosis). Plaque makes arteries stiff and narrow, which prevents enough blood from reaching the leg muscles.  Intermittent claudication can be treated with medicine and lifestyle changes. If medical treatment fails, surgery can be done to help return blood flow to the affected area.  Make sure you work with your health care provider to manage any other health conditions you may have, including diabetes, high blood pressure, or high cholesterol. This  information is not intended to replace advice given to you by your health care provider. Make sure you discuss any questions you have with your health care provider. Document Revised: 03/20/2017 Document Reviewed: 05/08/2016 Elsevier Patient Education  2020 [employer] Inc.  

## 2019-11-29 NOTE — Progress Notes (Signed)
Subjective:    Patient ID: Robin Keller, female    DOB: 1960-07-22, 59 y.o.   MRN: [id number]  Chief Complaint  Patient presents with  . Referral    cancer center told her inflamation in blood was high they told her to come here and get referral  . Edema    both legs as day goes on. starting in hands   Pt presents to the office today because she saw Hematologists for Leukocytosis. She had an elevated C-reactive protein and Sed rate. Her rheumatoid factor and ANA were negative   Denies any joint pain, rash, or photosensitivity.  Nicotine Dependence Presents for follow-up visit. Her urge triggers include company of smokers. She smokes < 1/2 a pack of cigarettes per day.  Diabetes She presents for her follow-up diabetic visit. She has type 2 diabetes mellitus. Her disease course has been stable. There are no hypoglycemic associated symptoms. Associated symptoms include foot paresthesias. Pertinent negatives for diabetes include no blurred vision. Symptoms are stable. Pertinent negatives for diabetic complications include no CVA or heart disease. Risk factors for coronary artery disease include dyslipidemia, hypertension, sedentary lifestyle and post-menopausal. She is following a generally healthy diet. Her overall blood glucose range is 140-180 mg/dl. An ACE inhibitor/angiotensin II receptor blocker is not being taken.      Review of Systems  Eyes: Negative for blurred vision.  All other systems reviewed and are negative.      Objective:   Physical Exam Vitals reviewed.  Constitutional:      General: She is not in acute distress.    Appearance: She is well-developed.  HENT:     Head: Normocephalic and atraumatic.     Right Ear: Tympanic membrane normal.     Left Ear: Tympanic membrane normal.     Ears:     Comments: Hoarse voice  Eyes:     Pupils: Pupils are equal, round, and reactive to light.  Neck:     Thyroid: No thyromegaly.  Cardiovascular:     Rate and Rhythm:  Normal rate and regular rhythm.     Heart sounds: Normal heart sounds. No murmur heard.   Pulmonary:     Effort: Pulmonary effort is normal. No respiratory distress.     Breath sounds: Normal breath sounds. No wheezing.  Abdominal:     General: Bowel sounds are normal. There is no distension.     Palpations: Abdomen is soft.     Tenderness: There is no abdominal tenderness.  Musculoskeletal:        General: No tenderness. Normal range of motion.     Cervical back: Normal range of motion and neck supple.  Skin:    General: Skin is warm and dry.  Neurological:     Mental Status: She is alert and oriented to person, place, and time.     Cranial Nerves: No cranial nerve deficit.     Deep Tendon Reflexes: Reflexes are normal and symmetric.  Psychiatric:        Behavior: Behavior normal.        Thought Content: Thought content normal.        Judgment: Judgment normal.     See Diabetic foot.   BP 132/85   Pulse 100   Temp 97.7 F (36.5 C) (Temporal)   Ht 5' 3.5" (1.613 m)   Wt 216 lb (98 kg)   SpO2 96%   BMI 37.66 kg/m      Assessment & Plan:  Robin Keller  comes in today with chief complaint of Referral (cancer center told her inflamation in blood was high they told her to come here and get referral) and Edema (both legs as day goes on. starting in hands)   Diagnosis and orders addressed:  1. Elevated C-reactive protein  2. Elevated sed rate  3. Peripheral edema - Ambulatory referral to Vascular Surgery - Compression stockings  4. Current smoker Smoking cessation discussed  - Ambulatory referral to Vascular Surgery  5. Intermittent claudication (HCC) Decrease smoking  6. Type 2 diabetes mellitus with other specified complication, without long-term current use of insulin (HCC) Will add Lisinopril 5 mg today - Microalbumin / creatinine urine ratio - lisinopril (ZESTRIL) 5 MG tablet; Take 1 tablet (5 mg total) by mouth daily.  Dispense: 90 tablet; Refill:  3  7. Extremity atherosclerosis with intermittent claudication [facility]) - Ambulatory referral to Vascular Surgery   Labs reviewed, will get on next visit Health Maintenance reviewed Diet and exercise encouraged  Follow up plan: 3 months    Jannifer Rodney, FNP

## 2019-11-30 LAB — MICROALBUMIN / CREATININE URINE RATIO
Creatinine, Urine: 40.4 mg/dL
Microalb/Creat Ratio: 11 mg/g creat (ref 0–29)
Microalbumin, Urine: 4.4 ug/mL

## 2019-12-27 ENCOUNTER — Encounter: Payer: 59 | Admitting: Pharmacist

## 2019-12-27 NOTE — Progress Notes (Signed)
This encounter was created in error - please disregard.

## 2020-01-18 ENCOUNTER — Other Ambulatory Visit: Payer: Self-pay | Admitting: *Deleted

## 2020-01-18 DIAGNOSIS — M25561 Pain in right knee: Secondary | ICD-10-CM

## 2020-02-06 ENCOUNTER — Other Ambulatory Visit: Payer: Self-pay

## 2020-02-06 ENCOUNTER — Encounter: Payer: 59 | Admitting: Surgery

## 2020-02-06 ENCOUNTER — Ambulatory Visit: Payer: 59 | Admitting: Vascular Surgery

## 2020-02-06 ENCOUNTER — Ambulatory Visit (HOSPITAL_COMMUNITY)
Admission: RE | Admit: 2020-02-06 | Discharge: 2020-02-06 | Disposition: A | Discharge status: Home / Self Care | Payer: 59 | Source: Ambulatory Visit | Attending: Vascular Surgery | Admitting: Vascular Surgery

## 2020-02-06 ENCOUNTER — Encounter: Payer: Self-pay | Admitting: Vascular Surgery

## 2020-02-06 VITALS — BP 150/89 | HR 90 | Temp 98.9°F | Resp 18 | Ht 61.0 in | Wt 218.0 lb

## 2020-02-06 DIAGNOSIS — M25562 Pain in left knee: Secondary | ICD-10-CM

## 2020-02-06 DIAGNOSIS — M25561 Pain in right knee: Secondary | ICD-10-CM

## 2020-02-06 NOTE — Progress Notes (Signed)
[facility]ist [facility] [place]  Patient name: Robin Keller MRN: [id number] DOB: 12/29/1960 Sex: female  REASON FOR VISIT: Evaluation lower extremity pain  HPI: Robin Keller is a 59 y.o. female here today for discussion of bilateral lower extremity pain.  She was seen in our [place] office in 2018 by Dr. Myra Gianotti.  At that time she had mostly right leg symptoms and these were felt to be consistent with claudication.  She was tried on Pletal with minimal improvement.  She presents now with a different character of pain.  Her main pain is in her left leg greater than her right leg.  She reports an aching sensation and a tight sensation.  She reports that she recently went to [facility] and had to be in a wheelchair for entire duration after the first day of standing.  This was due to pain and swelling in both lower extremities.  She has no history of DVT.  Past Medical History:  Diagnosis Date  . Arthralgia of both knees 12/09/2016  . CHEST PAIN, ATYPICAL, HX OF 08/07/2008   Qualifier: Diagnosis of  By: Eden Emms, MD, Harrington Challenger   . Chronic radicular lumbar pain 03/02/2017  . Cigarette smoker 08/07/2008   Qualifier: Diagnosis of  By: Eden Emms, MD, Harrington Challenger   . COPD (chronic obstructive pulmonary disease) (HCC) 05/25/2017  . DOE (dyspnea on exertion) 08/07/2008     PFT's   07/19/08  wnl  X slt flattening on insp portion of fv loop  - 06/04/2017  Walked RA x 3 laps @ 185 ft each stopped due to  End of study, fast pace, no  desat  But had to slow down on lap 3 due to sob   - Allergy profile  06/04/17  >  Eos 0.4 /  IgE  374 RAST neg  - CTa 06/08/2017  Nl    . Extremity atherosclerosis with intermittent claudication (HCC) 03/02/2017  . HYPERTENSION, BENIGN 08/07/2008   Qualifier: Diagnosis of  By: Eden Emms, MD, Harrington Challenger   . Morbid obesity due to excess calories (HCC) complicated by hbp/ doe 06/05/2017  . Myalgia 12/09/2016  . Pain in  joints of both feet 12/09/2016  . Palpitations 05/25/2017  . PULMONARY NODULE 08/07/2008   Qualifier: Diagnosis of  By: Shelle Iron MD, Maree Krabbe     Family History  Problem Relation Age of Onset  . Hypertension Mother   . Diabetes Mother   . Hyperlipidemia Mother   . Stroke Mother   . Diabetes Father   . Healthy Sister   . Healthy Daughter   . COPD Maternal Grandmother   . Heart attack Maternal Grandmother   . Healthy Sister   . Healthy Daughter   . Heart attack Maternal Grandfather     SOCIAL HISTORY: Social History   Tobacco Use  . Smoking status: Current Every Day Smoker    Packs/day: 0.30    Years: 32.00    Pack years: 9.60    Types: Cigarettes  . Smokeless tobacco: Never Used  . Tobacco comment: Using patches.   Substance Use Topics  . Alcohol use: No    No Known Allergies  Current Outpatient Medications  Medication Sig Dispense Refill  . albuterol (VENTOLIN HFA) 108 (90 Base) MCG/ACT inhaler Inhale 2 puffs into the lungs every 6 (six) hours as needed for wheezing or shortness of breath. 18 g 3  . cyanocobalamin 1000 MCG tablet Take 1,000 mcg by mouth daily.    Marland Kitchen  doxepin (SINEQUAN) 25 MG capsule Take 1 capsule (25 mg total) by mouth at bedtime as needed. 90 capsule 1  . lisinopril (ZESTRIL) 5 MG tablet Take 1 tablet (5 mg total) by mouth daily. 90 tablet 3  . rosuvastatin (CRESTOR) 20 MG tablet Take 1 tablet (20 mg total) by mouth daily. 90 tablet 3  . tiotropium (SPIRIVA HANDIHALER) 18 MCG inhalation capsule Place 1 capsule (18 mcg total) into inhaler and inhale daily. 30 capsule 12   No current facility-administered medications for this visit.    REVIEW OF SYSTEMS:  [X]  denotes positive finding, [ ]  denotes negative finding Cardiac  Comments:  Chest pain or chest pressure:    Shortness of breath upon exertion: x   Short of breath when lying flat:    Irregular heart rhythm:        Vascular    Pain in calf, thigh, or hip brought on by ambulation: x   Pain in  feet at night that wakes you up from your sleep:     Blood clot in your veins:    Leg swelling:  x         PHYSICAL EXAM: Vitals:   02/06/20 1459  BP: (!) 150/89  Pulse: 90  Resp: 18  Temp: 98.9 F (37.2 C)  TempSrc: Other (Comment)  SpO2: 96%  Weight: 218 lb (98.9 kg)  Height: 5\' 1"  (1.549 m)    GENERAL: The patient is a well-nourished female, in no acute distress. The vital signs are documented above. CARDIOVASCULAR: 2+ radial pulses bilaterally.  She has a 2+ left dorsalis pedis pulse.  I do not palpate pedal pulses on the right. PULMONARY: There is good air exchange  MUSCULOSKELETAL: There are no major deformities or cyanosis. NEUROLOGIC: No focal weakness or paresthesias are detected. SKIN: There are no ulcers or rashes noted. PSYCHIATRIC: The patient has a normal affect.  DATA:  Noninvasive studies from 01/08/2021 from [facility] were reviewed with the patient.  Ankle arm index is 0.83 on the right and normal at 0.99 on the left.  This is similar to our studies from our [place] office in 2018  MEDICAL ISSUES: I discussed these findings at length with the patient.  I do not see any evidence of arterial insufficiency causing her current symptoms.  This does appear to be related to the pressure and swelling that she is experiencing.  I explained her only treatment option would be conservative with weight loss, elevation and compression.  I explained that elevation we have to be legs higher than her heart.  She has tried compression and feels that this is making her swell above her knees.  I explained that although this is the case that feel that compression stockings probably the most important aspect to give her improvement in her's her current symptoms.  She will continue to attempt these measures and will see [facility] again on an as-needed basis    [place], MD [facility] Tel [phone]

## 2020-03-09 ENCOUNTER — Inpatient Hospital Stay (HOSPITAL_COMMUNITY): Payer: 59 | Admitting: Oncology

## 2020-03-09 ENCOUNTER — Inpatient Hospital Stay (HOSPITAL_COMMUNITY): Payer: 59 | Attending: Hematology

## 2020-04-05 ENCOUNTER — Other Ambulatory Visit: Payer: Self-pay | Admitting: Family

## 2020-04-05 DIAGNOSIS — F5101 Primary insomnia: Secondary | ICD-10-CM

## 2020-04-06 ENCOUNTER — Other Ambulatory Visit: Payer: Self-pay | Admitting: Family

## 2020-04-06 DIAGNOSIS — F5101 Primary insomnia: Secondary | ICD-10-CM

## 2020-04-06 NOTE — Telephone Encounter (Signed)
Needs to be seen for further refills

## 2020-05-11 ENCOUNTER — Other Ambulatory Visit: Payer: Self-pay | Admitting: Family

## 2020-05-11 DIAGNOSIS — F5101 Primary insomnia: Secondary | ICD-10-CM

## 2020-05-14 ENCOUNTER — Other Ambulatory Visit: Payer: Self-pay

## 2020-05-14 ENCOUNTER — Other Ambulatory Visit: Payer: Self-pay | Admitting: Family

## 2020-05-14 DIAGNOSIS — F5101 Primary insomnia: Secondary | ICD-10-CM

## 2020-05-15 ENCOUNTER — Other Ambulatory Visit: Payer: Self-pay | Admitting: Family

## 2020-05-15 DIAGNOSIS — F5101 Primary insomnia: Secondary | ICD-10-CM

## 2020-05-16 NOTE — Telephone Encounter (Signed)
Transmission on 05/14/20 did not go through, resent

## 2020-07-25 ENCOUNTER — Other Ambulatory Visit: Payer: Self-pay | Admitting: Family

## 2020-07-25 DIAGNOSIS — F5101 Primary insomnia: Secondary | ICD-10-CM

## 2020-07-28 ENCOUNTER — Other Ambulatory Visit: Payer: Self-pay | Admitting: Family

## 2020-07-28 DIAGNOSIS — F5101 Primary insomnia: Secondary | ICD-10-CM

## 2020-07-31 ENCOUNTER — Other Ambulatory Visit: Payer: Self-pay | Admitting: Family

## 2020-07-31 DIAGNOSIS — F5101 Primary insomnia: Secondary | ICD-10-CM

## 2020-08-05 ENCOUNTER — Other Ambulatory Visit: Payer: Self-pay | Admitting: Family

## 2020-08-05 DIAGNOSIS — F5101 Primary insomnia: Secondary | ICD-10-CM

## 2020-08-06 ENCOUNTER — Other Ambulatory Visit: Payer: Self-pay | Admitting: Family

## 2020-08-06 DIAGNOSIS — F5101 Primary insomnia: Secondary | ICD-10-CM

## 2020-08-06 MED ORDER — DOXEPIN HCL 25 MG PO CAPS: 25.0000 mg | ORAL_CAPSULE | Freq: Every evening | ORAL | 1 refills | Status: DC | PRN

## 2020-08-06 NOTE — Telephone Encounter (Signed)
Last seen in august please advise refill?

## 2021-02-27 ENCOUNTER — Other Ambulatory Visit: Payer: Self-pay | Admitting: Family

## 2021-02-27 DIAGNOSIS — F5101 Primary insomnia: Secondary | ICD-10-CM

## 2021-03-15 ENCOUNTER — Other Ambulatory Visit: Payer: Self-pay | Admitting: Family

## 2021-03-15 DIAGNOSIS — F5101 Primary insomnia: Secondary | ICD-10-CM

## 2021-05-01 ENCOUNTER — Encounter: Payer: Self-pay | Admitting: Family

## 2021-05-01 ENCOUNTER — Ambulatory Visit: Payer: 59 | Admitting: Family

## 2021-05-01 VITALS — BP 156/88 | HR 96 | Temp 97.7°F | Ht 61.0 in | Wt 212.0 lb

## 2021-05-01 DIAGNOSIS — E1159 Type 2 diabetes mellitus with other circulatory complications: Secondary | ICD-10-CM | POA: Diagnosis not present

## 2021-05-01 DIAGNOSIS — J449 Chronic obstructive pulmonary disease, unspecified: Secondary | ICD-10-CM

## 2021-05-01 DIAGNOSIS — R519 Headache, unspecified: Secondary | ICD-10-CM | POA: Diagnosis not present

## 2021-05-01 DIAGNOSIS — Z0001 Encounter for general adult medical examination with abnormal findings: Secondary | ICD-10-CM

## 2021-05-01 DIAGNOSIS — E1169 Type 2 diabetes mellitus with other specified complication: Secondary | ICD-10-CM

## 2021-05-01 DIAGNOSIS — F5101 Primary insomnia: Secondary | ICD-10-CM

## 2021-05-01 DIAGNOSIS — I739 Peripheral vascular disease, unspecified: Secondary | ICD-10-CM

## 2021-05-01 DIAGNOSIS — I152 Hypertension secondary to endocrine disorders: Secondary | ICD-10-CM

## 2021-05-01 DIAGNOSIS — F17210 Nicotine dependence, cigarettes, uncomplicated: Secondary | ICD-10-CM

## 2021-05-01 DIAGNOSIS — Z0000 Encounter for general adult medical examination without abnormal findings: Secondary | ICD-10-CM

## 2021-05-01 LAB — BAYER DCA HB A1C WAIVED: HB A1C (BAYER DCA - WAIVED): 7.2 % — ABNORMAL HIGH (ref 4.8–5.6)

## 2021-05-01 MED ORDER — LISINOPRIL 20 MG PO TABS: 20.0000 mg | ORAL_TABLET | Freq: Every day | ORAL | 3 refills | Status: DC

## 2021-05-01 MED ORDER — SUMATRIPTAN SUCCINATE 50 MG PO TABS: 50.0000 mg | ORAL_TABLET | ORAL | 0 refills | Status: AC | PRN

## 2021-05-01 MED ORDER — KETOROLAC TROMETHAMINE 60 MG/2ML IM SOLN
60.0000 mg | Freq: Once | INTRAMUSCULAR | Status: AC
  Administered 2021-05-01: 60 mg via INTRAMUSCULAR

## 2021-05-01 NOTE — Patient Instructions (Signed)
Tension Headache, Adult ?A tension headache is a feeling of pain, pressure, or aching over the front and sides of the head. The pain can be dull, or it can feel tight. There are two types of tension headache: ?Episodic tension headache. This is when the headaches happen fewer than 15 days a month. ?Chronic tension headache. This is when the headaches happen more than 15 days a month during a 3-month period. ?A tension headache can last from 30 minutes to several days. It is the most common kind of headache. Tension headaches are not normally associated with nausea or vomiting, and they do not get worse with physical activity. ?What are the causes? ?The exact cause of this condition is not known. Tension headaches are often triggered by stress, anxiety, or depression. Other triggers may include: ?Alcohol. ?Too much caffeine or caffeine withdrawal. ?Respiratory infections, such as colds, flu, or sinus infections. ?Dental problems or teeth clenching. ?Fatigue. ?Holding your head and neck in the same position for a long period of time, such as while using a computer. ?Smoking. ?Arthritis of the neck. ?What are the signs or symptoms? ?Symptoms of this condition include: ?A feeling of pressure or tightness around the head. ?Dull, aching head pain. ?Pain over the front and sides of the head. ?Tenderness in the muscles of the head, neck, and shoulders. ?How is this diagnosed? ?This condition may be diagnosed based on your symptoms, your medical history, and a physical exam. ?If your symptoms are severe or unusual, you may have imaging tests, such as a CT scan or an MRI of your head. Your vision may also be checked. ?How is this treated? ?This condition may be treated with lifestyle changes and with medicines that help relieve symptoms. ?Follow these instructions at home: ?Managing pain ?Take over-the-counter and prescription medicines only as told by your health care provider. ?When you have a headache, lie down in a dark,  quiet room. ?If directed, put ice on your head and neck. To do this: ?Put ice in a plastic bag. ?Place a towel between your skin and the bag. ?Leave the ice on for 20 minutes, 2-3 times a day. ?Remove the ice if your skin turns bright red. This is very important. If you cannot feel pain, heat, or cold, you have a greater risk of damage to the area. ?If directed, apply heat to the back of your neck as often as told by your health care provider. Use the heat source that your health care provider recommends, such as a moist heat pack or a heating pad. ?Place a towel between your skin and the heat source. ?Leave the heat on for 20-30 minutes. ?Remove the heat if your skin turns bright red. This is especially important if you are unable to feel pain, heat, or cold. You have a greater risk of getting burned. ?Eating and drinking ?Eat meals on a regular schedule. ?If you drink alcohol: ?Limit how much you have to: ?0-1 drink a day for women who are not pregnant. ?0-2 drinks a day for men. ?Know how much alcohol is in your drink. In the [place] one drink equals one 12 oz bottle of beer (355 mL), one 5 oz glass of wine (148 mL), or one 1? oz glass of hard liquor (44 mL). ?Drink enough fluid to keep your urine pale yellow. ?Decrease your caffeine intake, or stop using caffeine. ?Lifestyle ?Get 7-9 hours of sleep each night, or get the amount of sleep recommended by your health care provider. ?At bedtime,   remove computers, phones, and tablets from your room. ?Find ways to manage your stress. This may include: ?Exercise. ?Deep breathing exercises. ?Yoga. ?Listening to music. ?Positive mental imagery. ?Try to sit up straight and avoid tensing your muscles. ?Do not use any products that contain nicotine or tobacco. These include cigarettes, chewing tobacco, and vaping devices, such as e-cigarettes. If you need help quitting, ask your health care provider. ?General instructions ? ?Avoid any headache triggers. Keep a journal to help  find out what may trigger your headaches. For example, write down: ?What you eat and drink. ?How much sleep you get. ?Any change to your diet or medicines. ?Keep all follow-up visits. This is important. ?Contact a health care provider if: ?Your headache does not get better. ?Your headache comes back. ?You are sensitive to sounds, light, or smells because of a headache. ?You have nausea or you vomit. ?Your stomach hurts. ?Get help right away if: ?You suddenly develop a severe headache, along with any of the following: ?A stiff neck. ?Nausea and vomiting. ?Confusion. ?Weakness in one part or one side of your body. ?Double vision or loss of vision. ?Shortness of breath. ?Rash. ?Unusual sleepiness. ?Fever or chills. ?Trouble speaking. ?Pain in your eye or ear. ?Trouble walking or balancing. ?Feeling faint or passing out. ?Summary ?A tension headache is a feeling of pain, pressure, or aching over the front and sides of the head. ?A tension headache can last from 30 minutes to several days. It is the most common kind of headache. ?This condition may be diagnosed based on your symptoms, your medical history, and a physical exam. ?This condition may be treated with lifestyle changes and with medicines that help relieve symptoms. ?This information is not intended to replace advice given to you by your health care provider. Make sure you discuss any questions you have with your health care provider. ?Document Revised: 01/05/2020 Document Reviewed: 01/05/2020 ?Elsevier Patient Education ? 2022 Elsevier Inc. ? ?

## 2021-05-01 NOTE — Progress Notes (Signed)
Subjective:    Patient ID: Robin Keller, female    DOB: November 25, 1960, 61 y.o.   MRN: [id number]  Chief Complaint  Patient presents with   Headache    This one has lasted 2 weeks and she has tried meds otc. She is only had one day relief. Patient is experience blurry vision. Denies dizziness.    Pt presents to the office today with CPE and  headaches over the last two weeks. She has COPD and continues to smoke 1/2-1 pack a day. She has intermittent SOB at times.   Headache  This is a new problem. The current episode started 1 to 4 weeks ago. The problem occurs constantly. The pain is located in the Frontal region. The pain does not radiate. The pain quality is similar to prior headaches. The quality of the pain is described as throbbing and aching. The pain is at a severity of 10/10. The pain is moderate. Associated symptoms include insomnia, nausea, phonophobia and photophobia. Pertinent negatives include no vomiting. Nothing aggravates the symptoms. She has tried acetaminophen for the symptoms. The treatment provided mild relief. Her past medical history is significant for hypertension.  Hypertension Associated symptoms include headaches.  Diabetes Hypoglycemia symptoms include headaches.  Insomnia Primary symptoms: difficulty falling asleep, frequent awakening.   The current episode started more than one year. The onset quality is gradual. The problem occurs intermittently.  Nicotine Dependence Presents for follow-up visit. Symptoms include insomnia. Her urge triggers include company of smokers. The symptoms have been stable. She smokes 1 pack of cigarettes per day.     Review of Systems  Eyes:  Positive for photophobia.  Gastrointestinal:  Positive for nausea. Negative for vomiting.  Neurological:  Positive for headaches.  Psychiatric/Behavioral:  The patient has insomnia.   All other systems reviewed and are negative.  Family History  Problem Relation Age of Onset    Hypertension Mother    Diabetes Mother    Hyperlipidemia Mother    Stroke Mother    Diabetes Father    Healthy Sister    Healthy Daughter    COPD Maternal Grandmother    Heart attack Maternal Grandmother    Healthy Sister    Healthy Daughter    Heart attack Maternal Grandfather    Social History   Socioeconomic History   Marital status: Married    Spouse name: Not on file   Number of children: 2   Years of education: Not on file   Highest education level: Not on file  Occupational History   Occupation: EMPLOYED  Tobacco Use   Smoking status: Every Day    Packs/day: 0.30    Years: 32.00    Pack years: 9.60    Types: Cigarettes   Smokeless tobacco: Never   Tobacco comments:    Using patches.   Vaping Use   Vaping Use: Never used  Substance and Sexual Activity   Alcohol use: No   Drug use: No   Sexual activity: Yes    Birth control/protection: Surgical  Other Topics Concern   Not on file  Social History Narrative   Not on file   Social Determinants of Health   Financial Resource Strain: Not on file  Food Insecurity: Not on file  Transportation Needs: Not on file  Physical Activity: Not on file  Stress: Not on file  Social Connections: Not on file       Objective:   Physical Exam Vitals reviewed.  Constitutional:  General: She is not in acute distress.    Appearance: She is well-developed. She is obese.  HENT:     Head: Normocephalic and atraumatic.     Right Ear: External ear normal.  Eyes:     Pupils: Pupils are equal, round, and reactive to light.  Neck:     Thyroid: No thyromegaly.  Cardiovascular:     Rate and Rhythm: Normal rate and regular rhythm.     Heart sounds: Normal heart sounds. No murmur heard. Pulmonary:     Effort: Pulmonary effort is normal. No respiratory distress.     Breath sounds: Normal breath sounds. No wheezing.  Abdominal:     General: Bowel sounds are normal. There is no distension.     Palpations: Abdomen is soft.      Tenderness: There is no abdominal tenderness.  Musculoskeletal:        General: No tenderness. Normal range of motion.     Cervical back: Normal range of motion and neck supple.  Skin:    General: Skin is warm and dry.  Neurological:     Mental Status: She is alert and oriented to person, place, and time.     Cranial Nerves: No cranial nerve deficit.     Deep Tendon Reflexes: Reflexes are normal and symmetric.  Psychiatric:        Behavior: Behavior normal.        Thought Content: Thought content normal.        Judgment: Judgment normal.      BP (!) 156/88    Pulse 96    Temp 97.7 F (36.5 C) (Temporal)    Ht 5' 1"  (1.549 m)    Wt 212 lb (96.2 kg)    BMI 40.06 kg/m      Assessment & Plan:  GEONNA LOCKYER comes in today with chief complaint of Headache (This one has lasted 2 weeks and she has tried meds otc. She is only had one day relief. Patient is experience blurry vision. Denies dizziness. )   Diagnosis and orders addressed:  1. Hypertension associated with diabetes ([place]) Will start Lisinopril 20 mg -Daily blood pressure log given with instructions on how to fill out and told to bring to next visit -Dash diet information given -Exercise encouraged - Stress Management  -Continue current meds -RTO in 2 weeks  - lisinopril (ZESTRIL) 20 MG tablet; Take 1 tablet (20 mg total) by mouth daily.  Dispense: 90 tablet; Refill: 3 - CMP14+EGFR - CBC with Differential/Platelet  2. Type 2 diabetes mellitus with other specified complication, without long-term current use of insulin (HCC) - CMP14+EGFR - CBC with Differential/Platelet - Bayer DCA Hb A1c Waived  3. Chronic obstructive pulmonary disease, unspecified COPD type ([place]) - CMP14+EGFR - CBC with Differential/Platelet  4. Primary insomnia - CMP14+EGFR - CBC with Differential/Platelet  5. Cigarette smoker - CMP14+EGFR - CBC with Differential/Platelet  6. Morbid obesity due to excess calories ([place]) complicated by  hbp/ doe - CMP14+EGFR - CBC with Differential/Platelet  7. Intermittent claudication (HCC) - CMP14+EGFR - CBC with Differential/Platelet  8. Acute intractable headache, unspecified headache type Headache journal  Stress management  - CMP14+EGFR - CBC with Differential/Platelet - ketorolac (TORADOL) injection 60 mg - SUMAtriptan (IMITREX) 50 MG tablet; Take 1 tablet (50 mg total) by mouth every 2 (two) hours as needed for migraine. May repeat in 2 hours if headache persists or recurs.  Dispense: 10 tablet; Refill: 0  9. Annual physical exam - CMP14+EGFR - CBC  with Differential/Platelet - Lipid panel - Bayer DCA Hb A1c Waived - TSH   Labs pending Health Maintenance reviewed Diet and exercise encouraged  Follow up plan: 2 weeks to recheck HTN and headache  Evelina Dun, FNP

## 2021-05-02 ENCOUNTER — Other Ambulatory Visit: Payer: Self-pay | Admitting: Family

## 2021-05-02 DIAGNOSIS — E785 Hyperlipidemia, unspecified: Secondary | ICD-10-CM

## 2021-05-02 LAB — LIPID PANEL
Chol/HDL Ratio: 7.9 ratio — ABNORMAL HIGH (ref 0.0–4.4)
Cholesterol, Total: 276 mg/dL — ABNORMAL HIGH (ref 100–199)
HDL: 35 mg/dL — ABNORMAL LOW (ref >39)
LDL Chol Calc (NIH): 210 mg/dL — ABNORMAL HIGH (ref 0–99)
Triglycerides: 164 mg/dL — ABNORMAL HIGH (ref 0–149)
VLDL Cholesterol Cal: 31 mg/dL (ref 5–40)

## 2021-05-02 LAB — CBC WITH DIFFERENTIAL/PLATELET
Basophils Absolute: 0.1 10*3/uL (ref 0.0–0.2)
Basos: 0 %
EOS (ABSOLUTE): 0.3 10*3/uL (ref 0.0–0.4)
Eos: 3 %
Hematocrit: 41.5 % (ref 34.0–46.6)
Hemoglobin: 13.8 g/dL (ref 11.1–15.9)
Immature Grans (Abs): 0 10*3/uL (ref 0.0–0.1)
Immature Granulocytes: 0 %
Lymphocytes Absolute: 4 10*3/uL — ABNORMAL HIGH (ref 0.7–3.1)
Lymphs: 30 %
MCH: 29.5 pg (ref 26.6–33.0)
MCHC: 33.3 g/dL (ref 31.5–35.7)
MCV: 89 fL (ref 79–97)
Monocytes Absolute: 0.5 10*3/uL (ref 0.1–0.9)
Monocytes: 4 %
Neutrophils Absolute: 8.4 10*3/uL — ABNORMAL HIGH (ref 1.4–7.0)
Neutrophils: 63 %
Platelets: 278 10*3/uL (ref 150–450)
RBC: 4.68 x10E6/uL (ref 3.77–5.28)
RDW: 13.2 % (ref 11.7–15.4)
WBC: 13.4 10*3/uL — ABNORMAL HIGH (ref 3.4–10.8)

## 2021-05-02 LAB — CMP14+EGFR
ALT: 17 IU/L (ref 0–32)
AST: 18 IU/L (ref 0–40)
Albumin/Globulin Ratio: 1.2 (ref 1.2–2.2)
Albumin: 3.9 g/dL (ref 3.8–4.8)
Alkaline Phosphatase: 166 IU/L — ABNORMAL HIGH (ref 44–121)
BUN/Creatinine Ratio: 10 — ABNORMAL LOW (ref 12–28)
BUN: 8 mg/dL (ref 8–27)
Bilirubin Total: 0.2 mg/dL (ref 0.0–1.2)
CO2: 21 mmol/L (ref 20–29)
Calcium: 9 mg/dL (ref 8.7–10.3)
Chloride: 101 mmol/L (ref 96–106)
Creatinine, Ser: 0.78 mg/dL (ref 0.57–1.00)
Globulin, Total: 3.2 g/dL (ref 1.5–4.5)
Glucose: 170 mg/dL — ABNORMAL HIGH (ref 70–99)
Potassium: 3.9 mmol/L (ref 3.5–5.2)
Sodium: 138 mmol/L (ref 134–144)
Total Protein: 7.1 g/dL (ref 6.0–8.5)
eGFR: 86 mL/min/{1.73_m2} (ref >59)

## 2021-05-02 LAB — TSH: TSH: 0.843 u[IU]/mL (ref 0.450–4.500)

## 2021-05-02 MED ORDER — ROSUVASTATIN CALCIUM 20 MG PO TABS: 20.0000 mg | ORAL_TABLET | Freq: Every day | ORAL | 3 refills | Status: AC

## 2021-05-16 ENCOUNTER — Ambulatory Visit: Payer: 59 | Admitting: Family

## 2021-05-16 ENCOUNTER — Encounter: Payer: Self-pay | Admitting: Family

## 2021-05-16 VITALS — BP 158/84 | HR 93 | Temp 98.4°F | Ht 61.0 in | Wt 212.4 lb

## 2021-05-16 DIAGNOSIS — E1169 Type 2 diabetes mellitus with other specified complication: Secondary | ICD-10-CM | POA: Diagnosis not present

## 2021-05-16 DIAGNOSIS — E1159 Type 2 diabetes mellitus with other circulatory complications: Secondary | ICD-10-CM | POA: Diagnosis not present

## 2021-05-16 DIAGNOSIS — F17210 Nicotine dependence, cigarettes, uncomplicated: Secondary | ICD-10-CM

## 2021-05-16 DIAGNOSIS — J449 Chronic obstructive pulmonary disease, unspecified: Secondary | ICD-10-CM

## 2021-05-16 DIAGNOSIS — I152 Hypertension secondary to endocrine disorders: Secondary | ICD-10-CM

## 2021-05-16 MED ORDER — LISINOPRIL 40 MG PO TABS: 40.0000 mg | ORAL_TABLET | Freq: Every day | ORAL | 1 refills | Status: DC

## 2021-05-16 NOTE — Progress Notes (Signed)
Subjective:    Patient ID: Robin Keller, female    DOB: 03-Apr-1961, 61 y.o.   MRN: [id number]  Chief Complaint  Patient presents with   Follow-up   Pt presents to the office today to recheck HTN. She was seen two weeks ago and we started her on Lisinopril 20 mg.   She has COPD but has decreased her smoking to  4 cigarettes a day. She has intermittent SOB at times.  She is followed by Cardiologists annually for CAD.  Hypertension This is a chronic problem. The current episode started more than 1 year ago. The problem has been waxing and waning since onset. The problem is uncontrolled. Associated symptoms include malaise/fatigue. Pertinent negatives include no peripheral edema or shortness of breath. Risk factors for coronary artery disease include dyslipidemia, obesity, sedentary lifestyle and smoking/tobacco exposure. Past treatments include ACE inhibitors.     Review of Systems  Constitutional:  Positive for malaise/fatigue.  Respiratory:  Negative for shortness of breath.   All other systems reviewed and are negative.     Objective:   Physical Exam Vitals reviewed.  Constitutional:      General: She is not in acute distress.    Appearance: She is well-developed. She is obese.  HENT:     Head: Normocephalic and atraumatic.     Right Ear: Tympanic membrane normal.     Left Ear: Tympanic membrane normal.  Eyes:     Pupils: Pupils are equal, round, and reactive to light.  Neck:     Thyroid: No thyromegaly.  Cardiovascular:     Rate and Rhythm: Normal rate and regular rhythm.     Heart sounds: Normal heart sounds. No murmur heard. Pulmonary:     Effort: Pulmonary effort is normal. No respiratory distress.     Breath sounds: Normal breath sounds. No wheezing.  Abdominal:     General: Bowel sounds are normal. There is no distension.     Palpations: Abdomen is soft.     Tenderness: There is no abdominal tenderness.  Musculoskeletal:        General: No tenderness. Normal  range of motion.     Cervical back: Normal range of motion and neck supple.  Skin:    General: Skin is warm and dry.  Neurological:     Mental Status: She is alert and oriented to person, place, and time.     Cranial Nerves: No cranial nerve deficit.     Deep Tendon Reflexes: Reflexes are normal and symmetric.  Psychiatric:        Behavior: Behavior normal.        Thought Content: Thought content normal.        Judgment: Judgment normal.      BP (!) 158/84    Pulse 93    Temp 98.4 F (36.9 C) (Temporal)    Ht _0  (1.549 m)    Wt 212 lb 6.4 oz (96.3 kg)    SpO2 97%    BMI 40.13 kg/m      Assessment & Plan:  Robin Keller comes in today with chief complaint of Follow-up   Diagnosis and orders addressed:  1. Hypertension associated with diabetes ([place]) Will increase lisinopril to 40 mg from 20 mg  -Daily blood pressure log given with instructions on how to fill out and told to bring to next visit -Dash diet information given -Exercise encouraged - Stress Management  -Continue current meds -RTO in 2-4 weeks  - lisinopril (ZESTRIL) 40  MG tablet; Take 1 tablet (40 mg total) by mouth daily.  Dispense: 90 tablet; Refill: 1 - BMP8+EGFR  2. Chronic obstructive pulmonary disease, unspecified COPD type (HCC) - BMP8+EGFR  3. Type 2 diabetes mellitus with other specified complication, without long-term current use of insulin (HCC) - BMP8+EGFR  4. Cigarette smoker - BMP8+EGFR  5. Morbid obesity due to excess calories ([place]) complicated by hbp/ doe  - BMP8+EGFR   Labs pending Health Maintenance reviewed Diet and exercise encouraged  Follow up plan: 2-4 weeks   Evelina Dun, FNP

## 2021-05-16 NOTE — Patient Instructions (Signed)

## 2021-05-17 LAB — BMP8+EGFR
BUN/Creatinine Ratio: 9 — ABNORMAL LOW (ref 12–28)
BUN: 7 mg/dL — ABNORMAL LOW (ref 8–27)
CO2: 24 mmol/L (ref 20–29)
Calcium: 8.9 mg/dL (ref 8.7–10.3)
Chloride: 99 mmol/L (ref 96–106)
Creatinine, Ser: 0.74 mg/dL (ref 0.57–1.00)
Glucose: 194 mg/dL — ABNORMAL HIGH (ref 70–99)
Potassium: 3.7 mmol/L (ref 3.5–5.2)
Sodium: 146 mmol/L — ABNORMAL HIGH (ref 134–144)
eGFR: 92 mL/min/{1.73_m2} (ref >59)

## 2021-06-04 ENCOUNTER — Ambulatory Visit: Payer: 59 | Admitting: Family

## 2021-06-04 ENCOUNTER — Encounter: Payer: Self-pay | Admitting: Family

## 2021-06-04 VITALS — BP 174/91 | HR 82 | Temp 98.4°F | Ht 61.0 in | Wt 211.0 lb

## 2021-06-04 DIAGNOSIS — F17210 Nicotine dependence, cigarettes, uncomplicated: Secondary | ICD-10-CM | POA: Diagnosis not present

## 2021-06-04 DIAGNOSIS — I152 Hypertension secondary to endocrine disorders: Secondary | ICD-10-CM

## 2021-06-04 DIAGNOSIS — E1159 Type 2 diabetes mellitus with other circulatory complications: Secondary | ICD-10-CM

## 2021-06-04 DIAGNOSIS — J449 Chronic obstructive pulmonary disease, unspecified: Secondary | ICD-10-CM

## 2021-06-04 MED ORDER — ALBUTEROL SULFATE HFA 108 (90 BASE) MCG/ACT IN AERS: 2.0000 | INHALATION_SPRAY | Freq: Four times a day (QID) | RESPIRATORY_TRACT | 3 refills | Status: AC | PRN

## 2021-06-04 MED ORDER — AMLODIPINE BESYLATE 5 MG PO TABS: 5.0000 mg | ORAL_TABLET | Freq: Every day | ORAL | 1 refills | Status: DC

## 2021-06-04 NOTE — Patient Instructions (Signed)

## 2021-06-04 NOTE — Progress Notes (Signed)
Subjective:    Patient ID: Robin Keller, female    DOB: Feb 09, 1961, 61 y.o.   MRN: [id number]  Chief Complaint  Patient presents with   Medical Management of Chronic Issues   PT presents to the office today to recheck HTN. We have started her on lisinopril and increased it to 40 mg.   She reports she has been trying to eat a low salt diet and decrease her cigarettes to 4 over the last week. She has COPD and has intermittent SOB.  Hypertension This is a chronic problem. The current episode started more than 1 year ago. The problem has been waxing and waning since onset. The problem is uncontrolled. Associated symptoms include malaise/fatigue and shortness of breath. Pertinent negatives include no peripheral edema. Risk factors for coronary artery disease include dyslipidemia, obesity, smoking/tobacco exposure and sedentary lifestyle. Past treatments include ACE inhibitors. The current treatment provides no improvement.     Review of Systems  Constitutional:  Positive for malaise/fatigue.  Respiratory:  Positive for shortness of breath.   All other systems reviewed and are negative.     Objective:   Physical Exam Vitals reviewed.  Constitutional:      General: She is not in acute distress.    Appearance: She is well-developed.  HENT:     Head: Normocephalic and atraumatic.  Eyes:     Pupils: Pupils are equal, round, and reactive to light.  Neck:     Thyroid: No thyromegaly.  Cardiovascular:     Rate and Rhythm: Normal rate and regular rhythm.     Heart sounds: Normal heart sounds. No murmur heard. Pulmonary:     Effort: Pulmonary effort is normal. No respiratory distress.     Breath sounds: Normal breath sounds. No wheezing.  Abdominal:     General: Bowel sounds are normal. There is no distension.     Palpations: Abdomen is soft.     Tenderness: There is no abdominal tenderness.  Musculoskeletal:        General: No tenderness. Normal range of motion.     Cervical back:  Normal range of motion and neck supple.  Skin:    General: Skin is warm and dry.  Neurological:     Mental Status: She is alert and oriented to person, place, and time.     Cranial Nerves: No cranial nerve deficit.     Deep Tendon Reflexes: Reflexes are normal and symmetric.  Psychiatric:        Behavior: Behavior normal.        Thought Content: Thought content normal.        Judgment: Judgment normal.      BP (!) 180/83    Pulse 82    Temp 98.4 F (36.9 C) (Oral)    Ht 5' 1"  (1.549 m)    Wt 211 lb (95.7 kg)    SpO2 97%    BMI 39.87 kg/m      Assessment & Plan:  Robin Keller comes in today with chief complaint of Medical Management of Chronic Issues   Diagnosis and orders addressed:  1. Chronic obstructive pulmonary disease, unspecified COPD type (HCC) - albuterol (VENTOLIN HFA) 108 (90 Base) MCG/ACT inhaler; Inhale 2 puffs into the lungs every 6 (six) hours as needed for wheezing or shortness of breath.  Dispense: 18 g; Refill: 3 - BMP8+EGFR  2. Hypertension associated with diabetes ([place]) Will add Norvasc 5 mg  Continue Lisinopril 40 mg  -Daily blood pressure log given with  instructions on how to fill out and told to bring to next visit -Dash diet information given -Exercise encouraged - Stress Management  - amLODipine (NORVASC) 5 MG tablet; Take 1 tablet (5 mg total) by mouth daily.  Dispense: 90 tablet; Refill: 1 - BMP8+EGFR  3. Cigarette smoker - BMP8+EGFR   Labs pending Health Maintenance reviewed Diet and exercise encouraged  Follow up plan: 2-4 weeks   Evelina Dun, FNP

## 2021-06-05 LAB — BMP8+EGFR
BUN/Creatinine Ratio: 10 — ABNORMAL LOW (ref 12–28)
BUN: 8 mg/dL (ref 8–27)
CO2: 22 mmol/L (ref 20–29)
Calcium: 9 mg/dL (ref 8.7–10.3)
Chloride: 104 mmol/L (ref 96–106)
Creatinine, Ser: 0.77 mg/dL (ref 0.57–1.00)
Glucose: 204 mg/dL — ABNORMAL HIGH (ref 70–99)
Potassium: 3.6 mmol/L (ref 3.5–5.2)
Sodium: 142 mmol/L (ref 134–144)
eGFR: 88 mL/min/{1.73_m2} (ref >59)

## 2021-06-25 ENCOUNTER — Encounter: Payer: Self-pay | Admitting: Family

## 2021-06-25 ENCOUNTER — Ambulatory Visit: Payer: 59 | Admitting: Family

## 2021-06-25 VITALS — BP 146/82 | HR 91 | Temp 97.4°F | Ht 61.0 in | Wt 212.2 lb

## 2021-06-25 DIAGNOSIS — E1169 Type 2 diabetes mellitus with other specified complication: Secondary | ICD-10-CM | POA: Diagnosis not present

## 2021-06-25 DIAGNOSIS — J449 Chronic obstructive pulmonary disease, unspecified: Secondary | ICD-10-CM

## 2021-06-25 DIAGNOSIS — F411 Generalized anxiety disorder: Secondary | ICD-10-CM

## 2021-06-25 DIAGNOSIS — I152 Hypertension secondary to endocrine disorders: Secondary | ICD-10-CM

## 2021-06-25 DIAGNOSIS — F5101 Primary insomnia: Secondary | ICD-10-CM | POA: Diagnosis not present

## 2021-06-25 DIAGNOSIS — E1159 Type 2 diabetes mellitus with other circulatory complications: Secondary | ICD-10-CM

## 2021-06-25 DIAGNOSIS — Z87891 Personal history of nicotine dependence: Secondary | ICD-10-CM

## 2021-06-25 MED ORDER — BUSPIRONE HCL 5 MG PO TABS: 5.0000 mg | ORAL_TABLET | Freq: Three times a day (TID) | ORAL | 1 refills | Status: AC | PRN

## 2021-06-25 NOTE — Patient Instructions (Signed)

## 2021-06-25 NOTE — Progress Notes (Signed)
? ?Subjective:  ? ? Patient ID: Robin Keller, female    DOB: 1961/03/04, 61 y.o.   MRN: [id number] ? ?Chief Complaint  ?Patient presents with  ? Hypertension  ? ?Pt presents to the office today for follow up on hypertension. States since changing her medications she has had insomnia. She quit smoking 06/09/21.  ? ?She has COPD and taking albuterol as needed. States her breathing is worse when it is warmer.  ?Hypertension ?This is a chronic problem. The current episode started more than 1 year ago. The problem has been waxing and waning since onset. The problem is uncontrolled. Associated symptoms include anxiety and malaise/fatigue. Pertinent negatives include no blurred vision, peripheral edema or shortness of breath. Risk factors for coronary artery disease include dyslipidemia, obesity and sedentary lifestyle. Past treatments include ACE inhibitors and calcium channel blockers. The current treatment provides moderate improvement.  ?Anxiety ?Presents for follow-up visit. Symptoms include depressed mood, excessive worry and irritability. Patient reports no shortness of breath. Symptoms occur occasionally. The severity of symptoms is moderate.  ? ? ?Diabetes ?She presents for her follow-up diabetic visit. She has type 2 diabetes mellitus. Pertinent negatives for diabetes include no blurred vision. Diabetic complications include heart disease. Risk factors for coronary artery disease include dyslipidemia, diabetes mellitus, hypertension and sedentary lifestyle. She is following a generally unhealthy diet. (Does not check at home)  ? ? ? ?Review of Systems  ?Constitutional:  Positive for irritability and malaise/fatigue.  ?Eyes:  Negative for blurred vision.  ?Respiratory:  Negative for shortness of breath.   ?All other systems reviewed and are negative. ? ?   ?Objective:  ? Physical Exam ?Vitals reviewed.  ?Constitutional:   ?   General: She is not in acute distress. ?   Appearance: She is well-developed.  ?HENT:  ?    Head: Normocephalic and atraumatic.  ?   Right Ear: Tympanic membrane normal.  ?   Left Ear: Tympanic membrane normal.  ?Eyes:  ?   Pupils: Pupils are equal, round, and reactive to light.  ?Neck:  ?   Thyroid: No thyromegaly.  ?Cardiovascular:  ?   Rate and Rhythm: Normal rate and regular rhythm.  ?   Heart sounds: Normal heart sounds. No murmur heard. ?Pulmonary:  ?   Effort: Pulmonary effort is normal. No respiratory distress.  ?   Breath sounds: Normal breath sounds. No wheezing.  ?Abdominal:  ?   General: Bowel sounds are normal. There is no distension.  ?   Palpations: Abdomen is soft.  ?   Tenderness: There is no abdominal tenderness.  ?Musculoskeletal:     ?   General: No tenderness. Normal range of motion.  ?   Cervical back: Normal range of motion and neck supple.  ?Skin: ?   General: Skin is warm and dry.  ?Neurological:  ?   Mental Status: She is alert and oriented to person, place, and time.  ?   Cranial Nerves: No cranial nerve deficit.  ?   Deep Tendon Reflexes: Reflexes are normal and symmetric.  ?Psychiatric:     ?   Behavior: Behavior normal.     ?   Thought Content: Thought content normal.     ?   Judgment: Judgment normal.  ? ? ? ? ?BP (!) 148/83   Pulse 91   Temp (!) 97.4 ?F (36.3 ?C) (Temporal)   Ht 5\' 1"  (1.549 m)   Wt 212 lb 3.2 oz (96.3 kg)   SpO2 97%  BMI 40.09 kg/m?  ? ?   ?Assessment & Plan:  ? ?KERRIGAN RUPRIGHT comes in today with chief complaint of Hypertension ? ? ?Diagnosis and orders addressed: ? ?1. Hypertension associated with diabetes ([place]) ?-Continue medications ?-Dash diet information given ?-Exercise encouraged ?- Stress Management  ?-Continue current meds ? ?2. Chronic obstructive pulmonary disease, unspecified COPD type ([place]) ? ?3. Primary insomnia ?- busPIRone (BUSPAR) 5 MG tablet; Take 1 tablet (5 mg total) by mouth 3 (three) times daily as needed.  Dispense: 90 tablet; Refill: 1 ? ?4. Type 2 diabetes mellitus with other specified complication, without long-term  current use of insulin ([place]) ? ?5. Morbid obesity due to excess calories ([place]) complicated by hbp/ doe ? ?6. Former cigarette smoker ? ?7. GAD (generalized anxiety disorder) ?Start Buspar 5 mg TID  ?Stress management  ?RTO in 3-4 weeks  ?- busPIRone (BUSPAR) 5 MG tablet; Take 1 tablet (5 mg total) by mouth 3 (three) times daily as needed.  Dispense: 90 tablet; Refill: 1 ? ? ?Health Maintenance reviewed ?Diet and exercise encouraged ? ?Follow up plan: ?3-4 weeks to recheck GAD and HTN ? ? ?Evelina Dun, FNP ? ? ?

## 2021-07-03 MED ORDER — TRAZODONE HCL 50 MG PO TABS
50.0000 mg | ORAL_TABLET | Freq: Every evening | ORAL | 3 refills | Status: DC | PRN
Start: 2021-07-03 — End: 2021-07-18

## 2021-07-18 ENCOUNTER — Ambulatory Visit: Payer: 59 | Admitting: Family

## 2021-07-18 ENCOUNTER — Encounter: Payer: Self-pay | Admitting: Family

## 2021-07-18 VITALS — BP 134/76 | HR 96 | Temp 98.0°F | Ht 61.0 in | Wt 213.2 lb

## 2021-07-18 DIAGNOSIS — E1159 Type 2 diabetes mellitus with other circulatory complications: Secondary | ICD-10-CM

## 2021-07-18 DIAGNOSIS — J449 Chronic obstructive pulmonary disease, unspecified: Secondary | ICD-10-CM

## 2021-07-18 DIAGNOSIS — F411 Generalized anxiety disorder: Secondary | ICD-10-CM

## 2021-07-18 DIAGNOSIS — E1169 Type 2 diabetes mellitus with other specified complication: Secondary | ICD-10-CM

## 2021-07-18 DIAGNOSIS — F5101 Primary insomnia: Secondary | ICD-10-CM | POA: Diagnosis not present

## 2021-07-18 DIAGNOSIS — I152 Hypertension secondary to endocrine disorders: Secondary | ICD-10-CM

## 2021-07-18 MED ORDER — METFORMIN HCL ER 500 MG PO TB24: 500.0000 mg | ORAL_TABLET | Freq: Every day | ORAL | 1 refills | Status: AC

## 2021-07-18 MED ORDER — TRAZODONE HCL 150 MG PO TABS: 150.0000 mg | ORAL_TABLET | Freq: Every day | ORAL | 1 refills | Status: AC

## 2021-07-18 NOTE — Progress Notes (Signed)
? ?Subjective:  ? ? Patient ID: Robin Keller, female    DOB: May 12, 1960, 61 y.o.   MRN: [id number] ? ?Chief Complaint  ?Patient presents with  ? Depression  ? Hypertension  ?  3 week follow up  ? ?Pt presents to the office today for follow up on hypertension and GAD. She was started on Buspar 5 mg TID prn. States this has greatly helped her irritability, but still having trouble sleeping.  ? ?She has COPD and taking albuterol as needed. States her breathing is worse when it is warmer. She quit smoking 06/09/21.  ?  ?Hypertension ?This is a chronic problem. The current episode started more than 1 year ago. The problem has been resolved since onset. The problem is controlled. Associated symptoms include anxiety, malaise/fatigue and shortness of breath. Pertinent negatives include no blurred vision or peripheral edema. Risk factors for coronary artery disease include dyslipidemia, diabetes mellitus, obesity and sedentary lifestyle. The current treatment provides moderate improvement.  ?Anxiety ?Presents for follow-up visit. Symptoms include excessive worry, insomnia, irritability, nervous/anxious behavior and shortness of breath.  ? ? ?Insomnia ?Primary symptoms: difficulty falling asleep, frequent awakening, malaise/fatigue.   ?The current episode started more than one year. The onset quality is gradual. The problem occurs intermittently.  ?Diabetes ?She presents for her follow-up diabetic visit. She has type 2 diabetes mellitus. Hypoglycemia symptoms include nervousness/anxiousness. Associated symptoms include foot paresthesias. Pertinent negatives for diabetes include no blurred vision. Risk factors for coronary artery disease include dyslipidemia, diabetes mellitus, hypertension, sedentary lifestyle and post-menopausal. She is following a generally unhealthy diet. Her overall blood glucose range is 180-200 mg/dl.  ? ? ? ?Review of Systems  ?Constitutional:  Positive for irritability and malaise/fatigue.  ?Eyes:   Negative for blurred vision.  ?Respiratory:  Positive for shortness of breath.   ?Psychiatric/Behavioral:  The patient is nervous/anxious and has insomnia.   ?All other systems reviewed and are negative. ? ?   ?Objective:  ? Physical Exam ?Vitals reviewed.  ?Constitutional:   ?   General: She is not in acute distress. ?   Appearance: She is well-developed. She is obese.  ?HENT:  ?   Head: Normocephalic and atraumatic.  ?   Right Ear: Tympanic membrane normal.  ?   Left Ear: Tympanic membrane normal.  ?Eyes:  ?   Pupils: Pupils are equal, round, and reactive to light.  ?Neck:  ?   Thyroid: No thyromegaly.  ?Cardiovascular:  ?   Rate and Rhythm: Normal rate and regular rhythm.  ?   Heart sounds: Normal heart sounds. No murmur heard. ?Pulmonary:  ?   Effort: Pulmonary effort is normal. No respiratory distress.  ?   Breath sounds: Normal breath sounds. No wheezing.  ?Abdominal:  ?   General: Bowel sounds are normal. There is no distension.  ?   Palpations: Abdomen is soft.  ?   Tenderness: There is no abdominal tenderness.  ?Musculoskeletal:     ?   General: No tenderness. Normal range of motion.  ?   Cervical back: Normal range of motion and neck supple.  ?Skin: ?   General: Skin is warm and dry.  ?Neurological:  ?   Mental Status: She is alert and oriented to person, place, and time.  ?   Cranial Nerves: No cranial nerve deficit.  ?   Deep Tendon Reflexes: Reflexes are normal and symmetric.  ?Psychiatric:     ?   Behavior: Behavior normal.     ?   Thought  Content: Thought content normal.     ?   Judgment: Judgment normal.  ? ? ? ? ? ?  BP 134/76   Pulse 96   Temp 98 ?F (36.7 ?C) (Temporal)   Ht 5\' 1"  (1.549 m)   Wt 213 lb 3.2 oz (96.7 kg)   BMI 40.28 kg/m?  ? ?Assessment & Plan:  ?DULCE CHRESTMAN comes in today with chief complaint of Depression and Hypertension (3 week follow up) ? ? ?Diagnosis and orders addressed: ? ?1. Hypertension associated with diabetes ([place]) ? ?2. Type 2 diabetes mellitus with other  specified complication, without long-term current use of insulin ([place]) ?Add Metformin XR 500 mg  ?- metFORMIN (GLUCOPHAGE-XR) 500 MG 24 hr tablet; Take 1 tablet (500 mg total) by mouth daily with breakfast.  Dispense: 90 tablet; Refill: 1 ? ?3. Primary insomnia ?Increase trazodone to 150 mg from 50-100 mg  ?Sleep ritual  ?- traZODone (DESYREL) 150 MG tablet; Take 1 tablet (150 mg total) by mouth at bedtime.  Dispense: 90 tablet; Refill: 1 ? ?4. Morbid obesity due to excess calories ([place]) complicated by hbp/ doe ? ?5. Chronic obstructive pulmonary disease, unspecified COPD type ([place]) ? ?6. GAD (generalized anxiety disorder) ?Continue to Buspar 5 mg TID  ? ? ?Labs pending ?Health Maintenance reviewed ?Diet and exercise encouraged ? ?Follow up plan: ?2 months ? ? ?Evelina Dun, FNP ? ? ? ?

## 2021-07-18 NOTE — Patient Instructions (Signed)

## 2021-09-17 ENCOUNTER — Ambulatory Visit: Payer: 59 | Admitting: Family

## 2021-12-24 ENCOUNTER — Other Ambulatory Visit: Payer: Self-pay | Admitting: Family

## 2021-12-24 DIAGNOSIS — I152 Hypertension secondary to endocrine disorders: Secondary | ICD-10-CM

## 2022-05-26 ENCOUNTER — Encounter: Payer: Self-pay | Admitting: Family

## 2022-11-05 ENCOUNTER — Other Ambulatory Visit: Payer: Self-pay | Admitting: Family Medicine

## 2022-11-05 DIAGNOSIS — Z1211 Encounter for screening for malignant neoplasm of colon: Secondary | ICD-10-CM
